# Patient Record
Sex: Male | Born: 1985 | Race: White | Hispanic: No | Marital: Single | Smoking: Current every day smoker
Health system: Southern US, Community
[De-identification: ages and names within clinical notes are randomized; demographics above are authoritative.]

## PROBLEM LIST (undated history)

## (undated) DIAGNOSIS — J302 Other seasonal allergic rhinitis: Secondary | ICD-10-CM

## (undated) DIAGNOSIS — F909 Attention-deficit hyperactivity disorder, unspecified type: Secondary | ICD-10-CM

## (undated) DIAGNOSIS — F1910 Other psychoactive substance abuse, uncomplicated: Secondary | ICD-10-CM

## (undated) HISTORY — PX: TYMPANOSTOMY TUBE PLACEMENT: SHX32

---
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
## 2008-08-25 ENCOUNTER — Emergency Department (HOSPITAL_COMMUNITY): Admission: EM | Admit: 2008-08-25 | Discharge: 2008-08-25 | Payer: Self-pay | Admitting: Emergency Medicine

## 2009-06-21 ENCOUNTER — Emergency Department (HOSPITAL_BASED_OUTPATIENT_CLINIC_OR_DEPARTMENT_OTHER): Admission: EM | Admit: 2009-06-21 | Discharge: 2009-06-21 | Payer: Self-pay | Admitting: Emergency Medicine

## 2011-01-02 ENCOUNTER — Emergency Department (HOSPITAL_BASED_OUTPATIENT_CLINIC_OR_DEPARTMENT_OTHER)
Admission: EM | Admit: 2011-01-02 | Discharge: 2011-01-02 | Disposition: A | Discharge status: Home / Self Care | Payer: 59 | Attending: Emergency Medicine | Admitting: Emergency Medicine

## 2011-01-02 DIAGNOSIS — K089 Disorder of teeth and supporting structures, unspecified: Secondary | ICD-10-CM | POA: Insufficient documentation

## 2011-01-02 DIAGNOSIS — K029 Dental caries, unspecified: Secondary | ICD-10-CM | POA: Insufficient documentation

## 2011-01-02 DIAGNOSIS — F17200 Nicotine dependence, unspecified, uncomplicated: Secondary | ICD-10-CM | POA: Insufficient documentation

## 2011-04-18 ENCOUNTER — Emergency Department (HOSPITAL_COMMUNITY)
Admission: EM | Admit: 2011-04-18 | Discharge: 2011-04-18 | Disposition: A | Discharge status: Home / Self Care | Payer: 59 | Attending: Emergency Medicine | Admitting: Emergency Medicine

## 2011-04-18 ENCOUNTER — Emergency Department (HOSPITAL_COMMUNITY): Payer: 59

## 2011-04-18 DIAGNOSIS — S139XXA Sprain of joints and ligaments of unspecified parts of neck, initial encounter: Secondary | ICD-10-CM | POA: Insufficient documentation

## 2011-04-18 DIAGNOSIS — M542 Cervicalgia: Secondary | ICD-10-CM | POA: Insufficient documentation

## 2011-04-18 DIAGNOSIS — R51 Headache: Secondary | ICD-10-CM | POA: Insufficient documentation

## 2011-04-18 DIAGNOSIS — S0990XA Unspecified injury of head, initial encounter: Secondary | ICD-10-CM | POA: Insufficient documentation

## 2011-04-18 DIAGNOSIS — F17200 Nicotine dependence, unspecified, uncomplicated: Secondary | ICD-10-CM | POA: Insufficient documentation
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
## 2013-01-11 ENCOUNTER — Encounter (HOSPITAL_BASED_OUTPATIENT_CLINIC_OR_DEPARTMENT_OTHER): Payer: Self-pay | Admitting: *Deleted

## 2013-01-11 ENCOUNTER — Emergency Department (HOSPITAL_BASED_OUTPATIENT_CLINIC_OR_DEPARTMENT_OTHER)
Admission: EM | Admit: 2013-01-11 | Discharge: 2013-01-11 | Disposition: A | Discharge status: Home / Self Care | Payer: 59 | Attending: Emergency Medicine | Admitting: Emergency Medicine

## 2013-01-11 DIAGNOSIS — K047 Periapical abscess without sinus: Secondary | ICD-10-CM

## 2013-01-11 DIAGNOSIS — K029 Dental caries, unspecified: Secondary | ICD-10-CM | POA: Insufficient documentation

## 2013-01-11 DIAGNOSIS — F17200 Nicotine dependence, unspecified, uncomplicated: Secondary | ICD-10-CM | POA: Insufficient documentation

## 2013-01-11 DIAGNOSIS — K044 Acute apical periodontitis of pulpal origin: Secondary | ICD-10-CM | POA: Insufficient documentation

## 2013-01-11 DIAGNOSIS — K089 Disorder of teeth and supporting structures, unspecified: Secondary | ICD-10-CM | POA: Insufficient documentation

## 2013-01-11 DIAGNOSIS — K0889 Other specified disorders of teeth and supporting structures: Secondary | ICD-10-CM

## 2013-01-11 MED ORDER — AMOXICILLIN 500 MG PO CAPS: 500.0000 mg | ORAL_CAPSULE | Freq: Three times a day (TID) | ORAL | Status: DC

## 2013-01-11 NOTE — ED Notes (Signed)
Toothache since this am.  

## 2013-01-11 NOTE — ED Provider Notes (Signed)
History     CSN: [id number]  Arrival date & time 01/11/13  2022   First MD Initiated Contact with Patient 01/11/13 2052      Chief Complaint  Patient presents with  . Dental Pain    (Consider location/radiation/quality/duration/timing/severity/associated sxs/prior treatment) HPI Comments: 27 y/o male presents to the ED with his mom complaining of left lower dental pain x 1 day. States he was sitting at home earlier today when symptoms began. Describes the pain as sharp and "hurting", non radiating rated 8/10. Took a hydrocodone he got from a friend with relief. Pain worse with cold and salt water. Denies difficulty swallowing, fever or chills. He does not have a dentist.  Patient is a 27 y.o. male presenting with tooth pain. The history is provided by the patient and a parent.  Dental PainPrimary symptoms do not include fever.  Additional symptoms do not include: trouble swallowing.    History reviewed. No pertinent past medical history.  History reviewed. No pertinent past surgical history.  No family history on file.  History  Substance Use Topics  . Smoking status: Current Every Day Smoker -- 1.00 packs/day    Types: Cigarettes  . Smokeless tobacco: Not on file  . Alcohol Use: No      Review of Systems  Constitutional: Negative for fever and chills.  HENT: Positive for dental problem. Negative for trouble swallowing.   All other systems reviewed and are negative.    Allergies  Review of patient's allergies indicates no known allergies.  Home Medications   Current Outpatient Rx  Name  Route  Sig  Dispense  Refill  . amoxicillin (AMOXIL) 500 MG capsule   Oral   Take 1 capsule (500 mg total) by mouth 3 (three) times daily.   21 capsule   0     BP 126/82  Pulse 73  Temp(Src) 97.9 F (36.6 C) (Oral)  Resp 16  Ht 5\' 6"  (1.676 m)  Wt 122 lb (55.339 kg)  BMI 19.7 kg/m2  SpO2 97%  Physical Exam  Nursing note and vitals reviewed. Constitutional: He is  oriented to person, place, and time. He appears well-developed and well-nourished. No distress.  HENT:  Head: Normocephalic and atraumatic. No trismus in the jaw.  Mouth/Throat: Uvula is midline, oropharynx is clear and moist and mucous membranes are normal. Abnormal dentition. Dental caries present. No dental abscesses.    Poor dentition throughout. Tooth decay present on multiple teeth.  Eyes: Conjunctivae are normal.  Neck: Normal range of motion. Neck supple.  Cardiovascular: Normal rate, regular rhythm and normal heart sounds.   Pulmonary/Chest: Effort normal and breath sounds normal. No respiratory distress.  Musculoskeletal: Normal range of motion. He exhibits no edema.  Lymphadenopathy:       Head (left side): Submandibular adenopathy present.  Neurological: He is alert and oriented to person, place, and time.  Skin: Skin is warm.  Psychiatric: He has a normal mood and affect. His behavior is normal.    ED Course  Procedures (including critical care time)  Labs Reviewed - No data to display No results found.   1. Pain, dental   2. Dental infection       MDM   Dental pain associated with dental infection. No evidence of dental abscess. Patient is afebrile, non toxic appearing and swallowing secretions well. I gave patient referral to dentist and stressed the importance of dental follow up for ultimate management of dental pain. I will also give amoxicillin. Advised ibuprofen/tylenol for  pain. Patient voices understanding and is agreeable to plan.        Trevor Mace, PA-C 01/11/13 2118

## 2013-01-11 NOTE — ED Provider Notes (Signed)
Medical screening examination/treatment/procedure(s) were performed by non-physician practitioner and as supervising physician I was immediately available for consultation/collaboration.   Moselle Rister, MD 01/11/13 2315 

## 2013-03-21 ENCOUNTER — Emergency Department (HOSPITAL_BASED_OUTPATIENT_CLINIC_OR_DEPARTMENT_OTHER)
Admission: EM | Admit: 2013-03-21 | Discharge: 2013-03-21 | Disposition: A | Discharge status: Home / Self Care | Payer: 59 | Attending: Emergency Medicine | Admitting: Emergency Medicine

## 2013-03-21 ENCOUNTER — Encounter (HOSPITAL_BASED_OUTPATIENT_CLINIC_OR_DEPARTMENT_OTHER): Payer: Self-pay | Admitting: *Deleted

## 2013-03-21 ENCOUNTER — Emergency Department (HOSPITAL_BASED_OUTPATIENT_CLINIC_OR_DEPARTMENT_OTHER): Payer: 59

## 2013-03-21 DIAGNOSIS — Y9289 Other specified places as the place of occurrence of the external cause: Secondary | ICD-10-CM | POA: Insufficient documentation

## 2013-03-21 DIAGNOSIS — Y9389 Activity, other specified: Secondary | ICD-10-CM | POA: Insufficient documentation

## 2013-03-21 DIAGNOSIS — F17200 Nicotine dependence, unspecified, uncomplicated: Secondary | ICD-10-CM | POA: Insufficient documentation

## 2013-03-21 DIAGNOSIS — S62308A Unspecified fracture of other metacarpal bone, initial encounter for closed fracture: Secondary | ICD-10-CM

## 2013-03-21 DIAGNOSIS — S62309A Unspecified fracture of unspecified metacarpal bone, initial encounter for closed fracture: Secondary | ICD-10-CM | POA: Insufficient documentation

## 2013-03-21 DIAGNOSIS — X58XXXA Exposure to other specified factors, initial encounter: Secondary | ICD-10-CM | POA: Insufficient documentation
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
## 2013-03-22 ENCOUNTER — Ambulatory Visit (INDEPENDENT_AMBULATORY_CARE_PROVIDER_SITE_OTHER): Payer: 59 | Admitting: Family Medicine

## 2013-03-22 ENCOUNTER — Encounter: Payer: Self-pay | Admitting: Family Medicine

## 2013-03-22 VITALS — BP 121/73 | HR 84 | Ht 66.0 in | Wt 120.0 lb

## 2013-03-22 DIAGNOSIS — S6992XA Unspecified injury of left wrist, hand and finger(s), initial encounter: Secondary | ICD-10-CM

## 2013-03-22 DIAGNOSIS — S62339A Displaced fracture of neck of unspecified metacarpal bone, initial encounter for closed fracture: Secondary | ICD-10-CM

## 2013-03-22 DIAGNOSIS — S6990XA Unspecified injury of unspecified wrist, hand and finger(s), initial encounter: Secondary | ICD-10-CM

## 2013-03-22 MED ORDER — HYDROCODONE-ACETAMINOPHEN 5-325 MG PO TABS: 1.0000 | ORAL_TABLET | Freq: Four times a day (QID) | ORAL | Status: DC | PRN

## 2013-03-22 NOTE — Patient Instructions (Addendum)
You have a fracture of your 3rd metacarpal. Wear the splint at all times as you would a cast. Ok to ice over this 15 minutes at a time 3-4 times a day. Keep elevated as much as possible. Ok to take aleve 2 tabs twice a day with food for pain and inflammation. Norco as needed for severe pain. Follow up with me in 2 weeks for repeat x-rays, reevaluation.

## 2013-03-23 ENCOUNTER — Encounter: Payer: Self-pay | Admitting: Family Medicine

## 2013-03-23 DIAGNOSIS — S62339A Displaced fracture of neck of unspecified metacarpal bone, initial encounter for closed fracture: Secondary | ICD-10-CM | POA: Insufficient documentation

## 2013-03-23 NOTE — Assessment & Plan Note (Signed)
Left incomplete distal 3rd metacarpal fracture - Changed patient to an ulnar gutter splint - only able to get about 30 degrees flexion of MCP joint due to pain.  Discussed icing, elevation, wearing splint as he would a cast.  Aleve, norco for pain.  F/u in 2 weeks to repeat evaluation, x-rays.
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
## 2013-04-05 ENCOUNTER — Ambulatory Visit: Payer: 59 | Admitting: Family Medicine

## 2013-06-03 ENCOUNTER — Emergency Department (HOSPITAL_BASED_OUTPATIENT_CLINIC_OR_DEPARTMENT_OTHER)
Admission: EM | Admit: 2013-06-03 | Discharge: 2013-06-03 | Disposition: A | Discharge status: Home / Self Care | Payer: 59 | Attending: Emergency Medicine | Admitting: Emergency Medicine

## 2013-06-03 ENCOUNTER — Encounter (HOSPITAL_BASED_OUTPATIENT_CLINIC_OR_DEPARTMENT_OTHER): Payer: Self-pay | Admitting: Emergency Medicine

## 2013-06-03 DIAGNOSIS — F17200 Nicotine dependence, unspecified, uncomplicated: Secondary | ICD-10-CM | POA: Insufficient documentation

## 2013-06-03 DIAGNOSIS — K089 Disorder of teeth and supporting structures, unspecified: Secondary | ICD-10-CM | POA: Insufficient documentation

## 2013-06-03 DIAGNOSIS — K0889 Other specified disorders of teeth and supporting structures: Secondary | ICD-10-CM

## 2013-06-03 MED ORDER — PENICILLIN V POTASSIUM 500 MG PO TABS: 500.0000 mg | ORAL_TABLET | Freq: Three times a day (TID) | ORAL | Status: DC

## 2013-06-03 MED ORDER — PENICILLIN V POTASSIUM 250 MG PO TABS
500.0000 mg | ORAL_TABLET | Freq: Once | ORAL | Status: AC
  Administered 2013-06-03: 500 mg via ORAL
  Filled 2013-06-03: qty 2

## 2013-06-03 NOTE — ED Notes (Signed)
Pt c/o left sided molar dental pain.

## 2013-06-03 NOTE — ED Provider Notes (Signed)
CSN: [id number]     Arrival date & time 06/03/13  2046 History     First MD Initiated Contact with Patient 06/03/13 2128     Chief Complaint  Patient presents with  . Dental Pain   (Consider location/radiation/quality/duration/timing/severity/associated sxs/prior Treatment) Patient is a 27 y.o. male presenting with tooth pain. The history is provided by the patient.  Dental Pain Location:  Lower Lower teeth location:  19/LL 1st molar and 17/LL 3rd molar Quality:  Aching Severity:  Mild Onset quality:  Gradual Timing:  Constant Chronicity:  Recurrent Context: dental caries   Context: not abscess   Relieved by:  Nothing Worsened by:  Hot food/drink and jaw movement Associated symptoms: no difficulty swallowing, no fever, no gum swelling, no neck swelling and no oral bleeding   Risk factors: lack of dental care and smoking   Risk factors: no cancer and no chewing tobacco use     History reviewed. No pertinent past medical history. History reviewed. No pertinent past surgical history. Family History  Problem Relation Age of Onset  . Diabetes Mother   . Hyperlipidemia Mother   . Hypertension Mother   . Hypertension Brother   . Heart attack Neg Hx   . Sudden death Neg Hx    History  Substance Use Topics  . Smoking status: Current Every Day Smoker -- 1.00 packs/day    Types: Cigarettes  . Smokeless tobacco: Not on file  . Alcohol Use: No    Review of Systems  Constitutional: Negative for fever.  All other systems reviewed and are negative.    Allergies  Review of patient's allergies indicates no known allergies.  Home Medications   Current Outpatient Rx  Name  Route  Sig  Dispense  Refill  . ibuprofen (ADVIL,MOTRIN) 200 MG tablet   Oral   Take 400 mg by mouth as needed for pain.         Marland Kitchen HYDROcodone-acetaminophen (NORCO) 5-325 MG per tablet   Oral   Take 1 tablet by mouth every 6 (six) hours as needed for pain.   60 tablet   0   . penicillin v  potassium (VEETID) 500 MG tablet   Oral   Take 1 tablet (500 mg total) by mouth 3 (three) times daily.   30 tablet   0    BP 138/86  Pulse 82  Temp(Src) 98.1 F (36.7 C) (Oral)  Resp 18  Ht 5\' 6"  (1.676 m)  Wt 120 lb (54.432 kg)  BMI 19.38 kg/m2  SpO2 98% Physical Exam  Nursing note and vitals reviewed. Constitutional: He is oriented to person, place, and time. He appears well-developed and well-nourished.  HENT:  Head: Normocephalic and atraumatic.  Right Ear: External ear normal.  Left Ear: External ear normal.  Mouth/Throat: Oropharynx is clear and moist.  Pain and dental caries left lower molars  Eyes: Conjunctivae are normal. Pupils are equal, round, and reactive to light.  Cardiovascular: Normal rate.   Pulmonary/Chest: He is in respiratory distress.  Abdominal: Soft. Bowel sounds are normal.  Musculoskeletal: Normal range of motion.  Neurological: He is alert and oriented to person, place, and time.  Skin: Skin is warm and dry.  Psychiatric: He has a normal mood and affect.    ED Course   Procedures (including critical care time)  Labs Reviewed - No data to display No results found. 1. Pain, dental     MDM  Patient with dental caries with increased pain, no swelling.   Duwayne Heck  Denny Levy, MD 06/03/13 2151

## 2013-06-03 NOTE — ED Notes (Signed)
MD at bedside. 

## 2014-05-26 ENCOUNTER — Emergency Department (HOSPITAL_COMMUNITY)
Admission: EM | Admit: 2014-05-26 | Discharge: 2014-05-26 | Disposition: A | Discharge status: Home / Self Care | Payer: 59 | Attending: Emergency Medicine | Admitting: Emergency Medicine

## 2014-05-26 ENCOUNTER — Encounter (HOSPITAL_COMMUNITY): Payer: Self-pay | Admitting: Emergency Medicine

## 2014-05-26 DIAGNOSIS — S46812A Strain of other muscles, fascia and tendons at shoulder and upper arm level, left arm, initial encounter: Secondary | ICD-10-CM

## 2014-05-26 DIAGNOSIS — Z792 Long term (current) use of antibiotics: Secondary | ICD-10-CM | POA: Insufficient documentation

## 2014-05-26 DIAGNOSIS — S0990XA Unspecified injury of head, initial encounter: Secondary | ICD-10-CM | POA: Diagnosis not present

## 2014-05-26 DIAGNOSIS — S46819A Strain of other muscles, fascia and tendons at shoulder and upper arm level, unspecified arm, initial encounter: Principal | ICD-10-CM

## 2014-05-26 DIAGNOSIS — Y9389 Activity, other specified: Secondary | ICD-10-CM | POA: Diagnosis not present

## 2014-05-26 DIAGNOSIS — Y92410 Unspecified street and highway as the place of occurrence of the external cause: Secondary | ICD-10-CM | POA: Insufficient documentation

## 2014-05-26 DIAGNOSIS — S0993XA Unspecified injury of face, initial encounter: Secondary | ICD-10-CM | POA: Diagnosis present

## 2014-05-26 DIAGNOSIS — F17200 Nicotine dependence, unspecified, uncomplicated: Secondary | ICD-10-CM | POA: Insufficient documentation

## 2014-05-26 DIAGNOSIS — S43499A Other sprain of unspecified shoulder joint, initial encounter: Secondary | ICD-10-CM | POA: Diagnosis not present

## 2014-05-26 DIAGNOSIS — S199XXA Unspecified injury of neck, initial encounter: Secondary | ICD-10-CM

## 2014-05-26 MED ORDER — CYCLOBENZAPRINE HCL 10 MG PO TABS: 10.0000 mg | ORAL_TABLET | Freq: Two times a day (BID) | ORAL | Status: DC | PRN
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
## 2014-05-26 NOTE — Discharge Instructions (Signed)
Take Flexeril as needed for muscle spasm. Refer to attached documents for more information.  °
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
## 2014-05-27 NOTE — ED Provider Notes (Signed)
Medical screening examination/treatment/procedure(s) were performed by non-physician practitioner and as supervising physician I was immediately available for consultation/collaboration.  Megan Docherty, MD 05/27/14 2029 

## 2014-05-30 ENCOUNTER — Encounter (HOSPITAL_BASED_OUTPATIENT_CLINIC_OR_DEPARTMENT_OTHER): Payer: Self-pay | Admitting: Emergency Medicine

## 2014-05-30 ENCOUNTER — Emergency Department (HOSPITAL_BASED_OUTPATIENT_CLINIC_OR_DEPARTMENT_OTHER)
Admission: EM | Admit: 2014-05-30 | Discharge: 2014-05-31 | Disposition: A | Discharge status: Home / Self Care | Payer: 59 | Attending: Emergency Medicine | Admitting: Emergency Medicine

## 2014-05-30 DIAGNOSIS — Z792 Long term (current) use of antibiotics: Secondary | ICD-10-CM | POA: Diagnosis not present

## 2014-05-30 DIAGNOSIS — F17200 Nicotine dependence, unspecified, uncomplicated: Secondary | ICD-10-CM | POA: Diagnosis not present

## 2014-05-30 DIAGNOSIS — R253 Fasciculation: Secondary | ICD-10-CM

## 2014-05-30 DIAGNOSIS — Z87828 Personal history of other (healed) physical injury and trauma: Secondary | ICD-10-CM | POA: Insufficient documentation

## 2014-05-30 DIAGNOSIS — Z791 Long term (current) use of non-steroidal anti-inflammatories (NSAID): Secondary | ICD-10-CM | POA: Insufficient documentation

## 2014-05-30 DIAGNOSIS — R259 Unspecified abnormal involuntary movements: Secondary | ICD-10-CM | POA: Diagnosis not present

## 2014-05-30 IMAGING — CR DG HAND COMPLETE 3+V*L*
3 series · 3 of 3 positions shown · non-contrast
Comparison: No priors.

CLINICAL DATA: Pain injury.  Pain in the middle finger and back of
the hand.

LEFT HAND - COMPLETE 3+ VIEW

[x hand pa left]
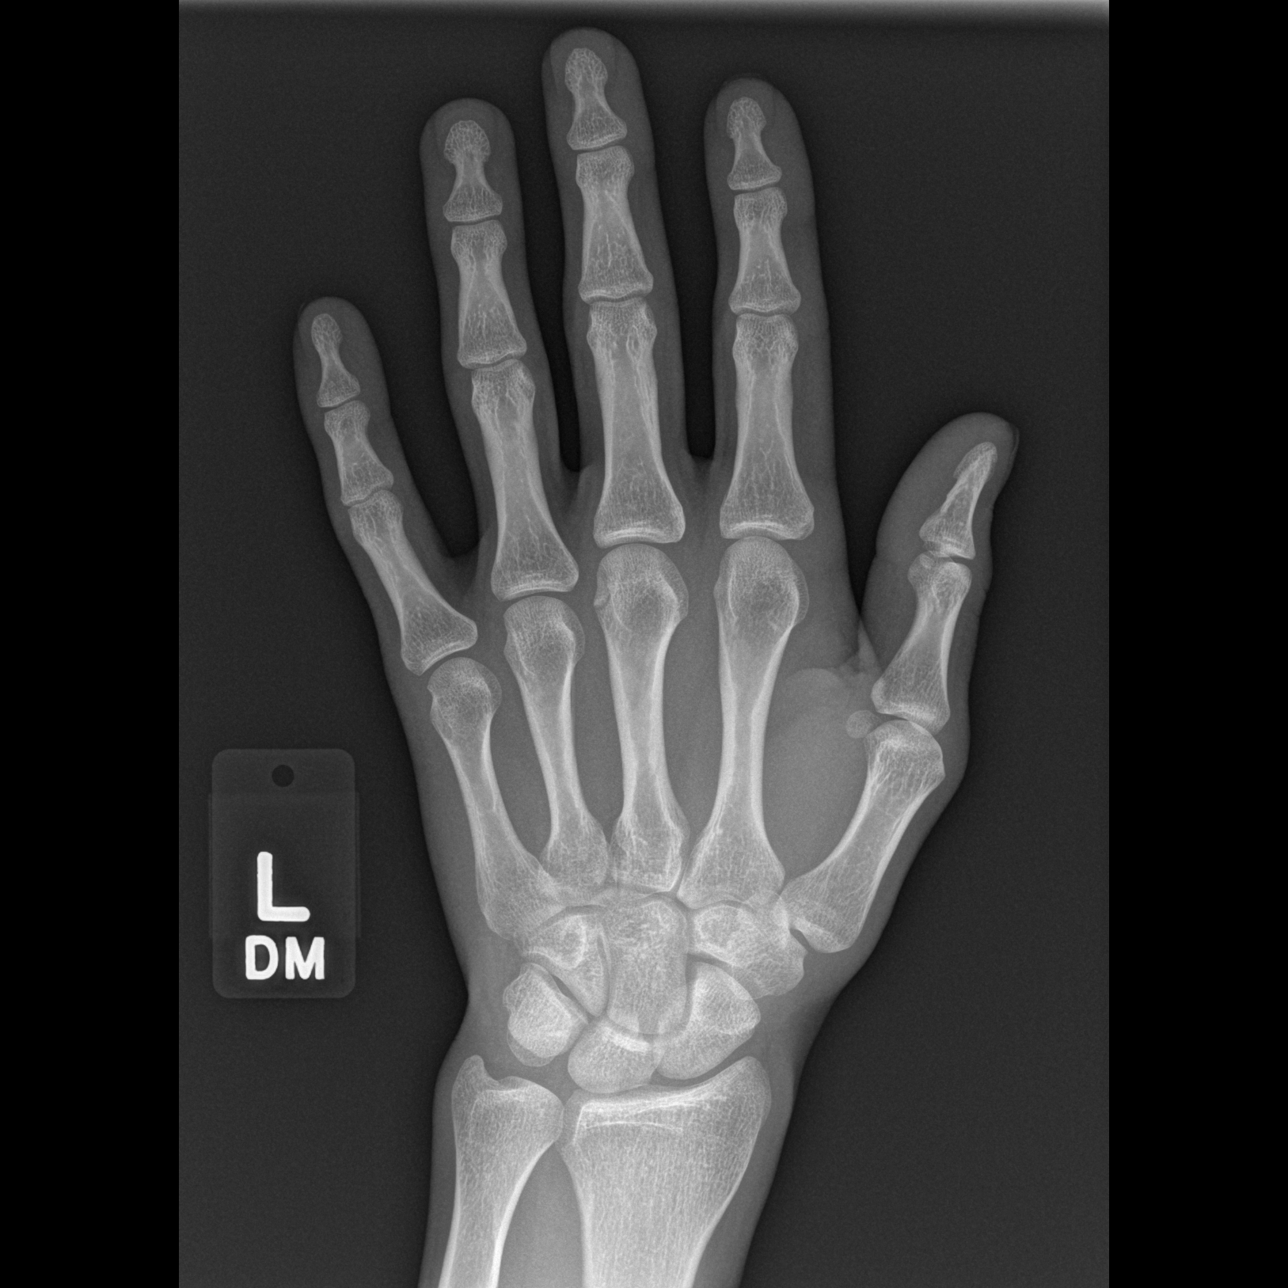

[x hand oblique left]
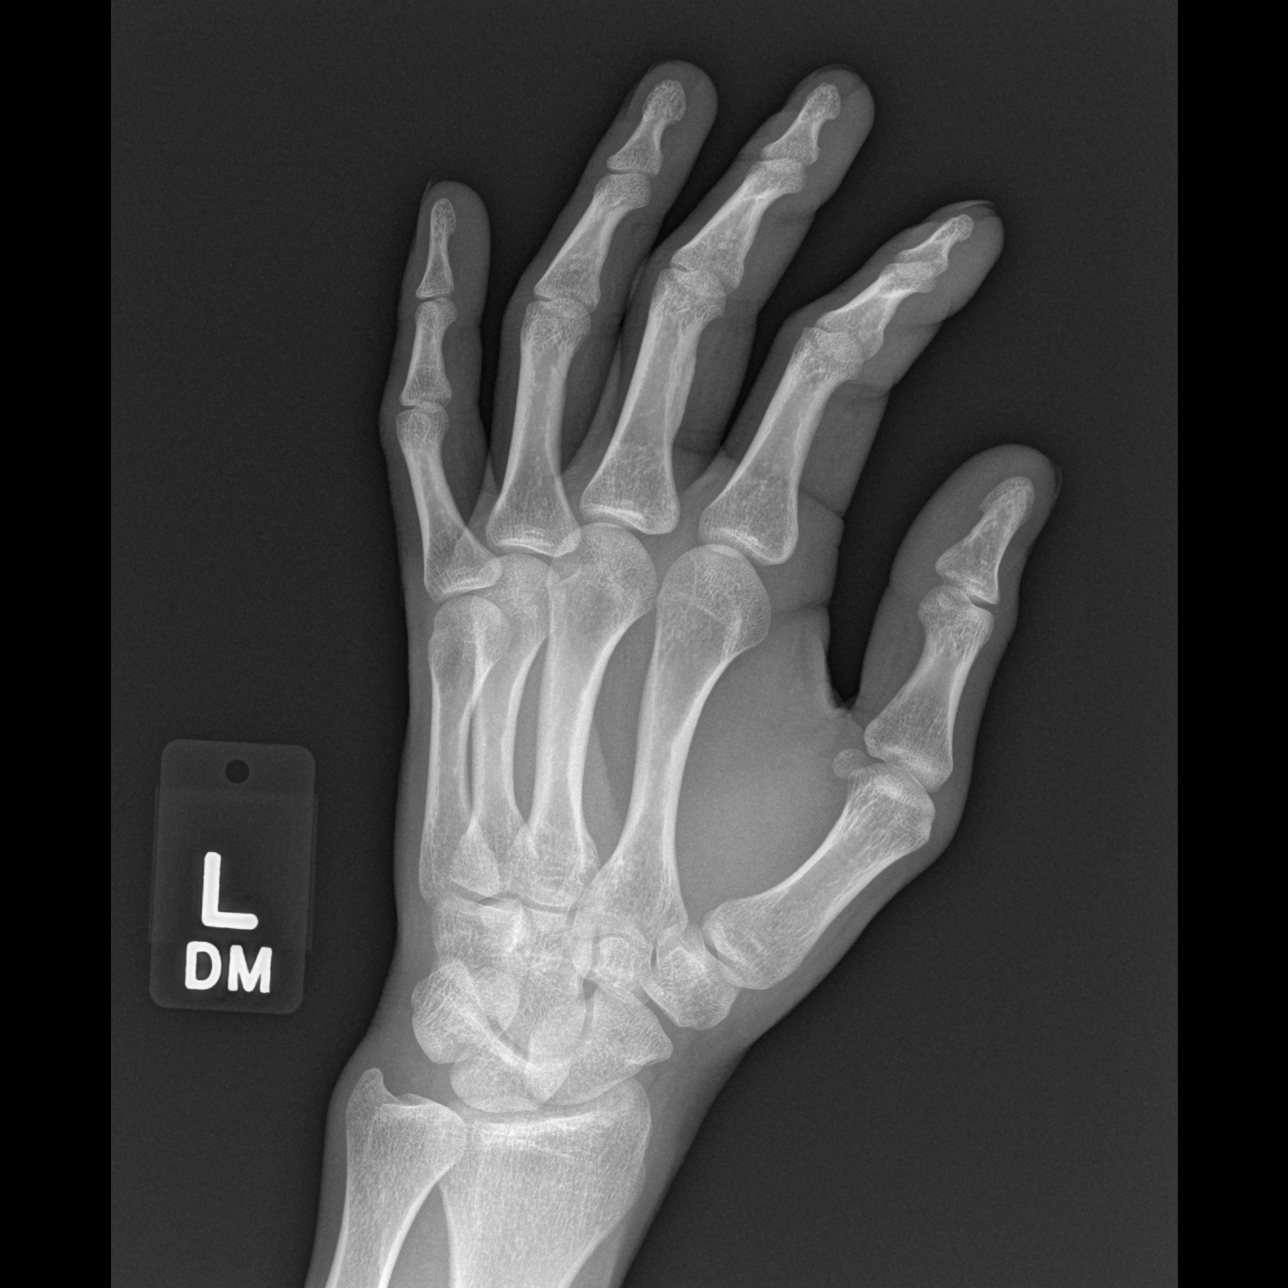

[x hand lat left]
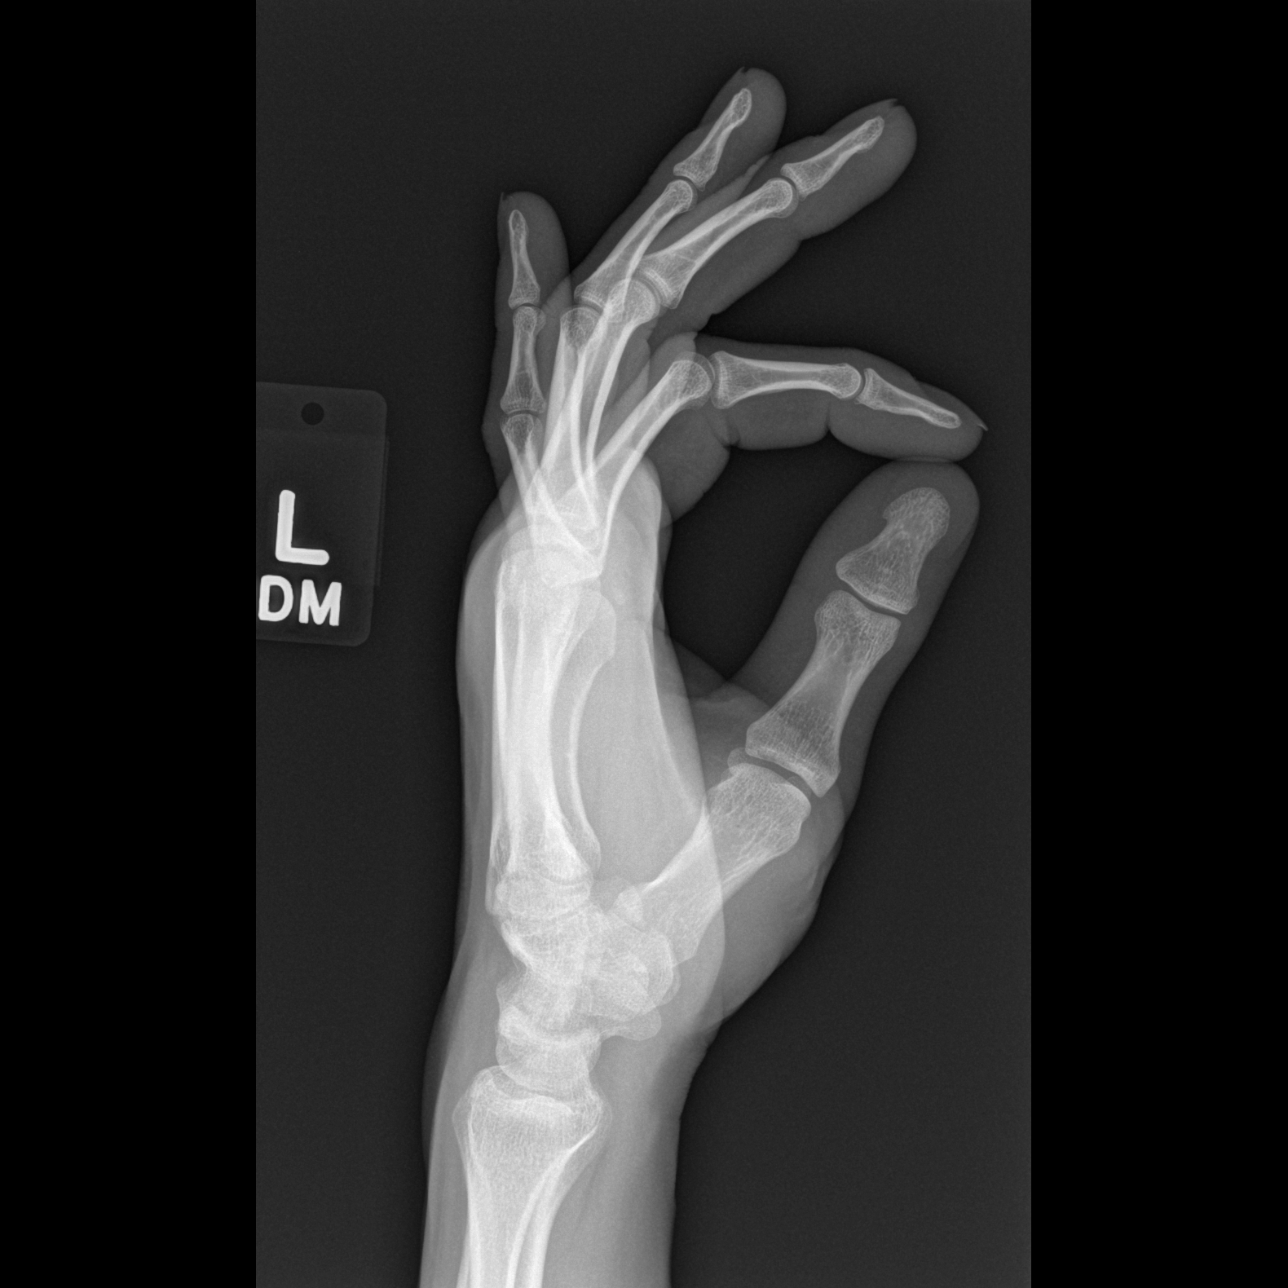

[3 of 3 positions shown; findings below may reference images not displayed]

FINDINGS: Three views of the left hand demonstrates an incomplete
fracture through the ulnar aspect of the distal third metacarpal.
No other acute displaced fracture, subluxation or dislocation is
noted.  Overlying soft tissues appear swollen.
IMPRESSION: Incomplete, probable impaction fracture, of the ulnar aspect of the
distal third metacarpal.
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
## 2014-05-31 ENCOUNTER — Encounter (HOSPITAL_BASED_OUTPATIENT_CLINIC_OR_DEPARTMENT_OTHER): Payer: Self-pay | Admitting: Emergency Medicine
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
## 2014-07-24 ENCOUNTER — Emergency Department (HOSPITAL_BASED_OUTPATIENT_CLINIC_OR_DEPARTMENT_OTHER)
Admission: EM | Admit: 2014-07-24 | Discharge: 2014-07-24 | Disposition: A | Discharge status: Home / Self Care | Payer: 59 | Attending: Emergency Medicine | Admitting: Emergency Medicine

## 2014-07-24 ENCOUNTER — Encounter (HOSPITAL_BASED_OUTPATIENT_CLINIC_OR_DEPARTMENT_OTHER): Payer: Self-pay | Admitting: Emergency Medicine

## 2014-07-24 DIAGNOSIS — Z720 Tobacco use: Secondary | ICD-10-CM | POA: Diagnosis not present

## 2014-07-24 DIAGNOSIS — Z792 Long term (current) use of antibiotics: Secondary | ICD-10-CM | POA: Diagnosis not present

## 2014-07-24 DIAGNOSIS — K088 Other specified disorders of teeth and supporting structures: Secondary | ICD-10-CM | POA: Insufficient documentation

## 2014-07-24 DIAGNOSIS — K0889 Other specified disorders of teeth and supporting structures: Secondary | ICD-10-CM

## 2014-07-24 HISTORY — DX: Other seasonal allergic rhinitis: J30.2

## 2014-07-24 MED ORDER — AMOXICILLIN 500 MG PO CAPS
500.0000 mg | ORAL_CAPSULE | Freq: Three times a day (TID) | ORAL | Status: AC
Start: 2014-07-24 — End: 2014-08-03

## 2014-07-24 MED ORDER — HYDROCODONE-ACETAMINOPHEN 5-325 MG PO TABS: 2.0000 | ORAL_TABLET | ORAL | Status: DC | PRN

## 2014-07-24 NOTE — ED Notes (Signed)
patient c/o L lower side dental pain for the past two days, states he had sinus pressure and took some allegra-d for relief, but tooth continues to hurt. Has taken ibuprofen and some amoxicillin his girl friend gave him

## 2014-07-24 NOTE — Discharge Instructions (Signed)

## 2014-07-24 NOTE — ED Provider Notes (Signed)
CSN: [id number]     Arrival date & time 07/24/14  1429 History   First MD Initiated Contact with Patient 07/24/14 1457     Chief Complaint  Patient presents with  . Dental Pain     (Consider location/radiation/quality/duration/timing/severity/associated sxs/prior Treatment) Patient is a 28 y.o. male presenting with tooth pain. The history is provided by the patient. No language interpreter was used.  Dental Pain Location:  Lower Lower teeth location:  21/LL 1st bicuspid, 20/LL 2nd bicuspid, 19/LL 1st molar and 18/LL 2nd molar Quality:  Shooting Severity:  Moderate Onset quality:  Gradual Duration:  2 days Timing:  Constant Progression:  Worsening Chronicity:  New Context: poor dentition   Relieved by:  Nothing Worsened by:  Nothing tried Ineffective treatments:  None tried Associated symptoms: facial pain   Risk factors: no alcohol problem     Past Medical History  Diagnosis Date  . Seasonal allergies    History reviewed. No pertinent past surgical history. Family History  Problem Relation Age of Onset  . Diabetes Mother   . Hyperlipidemia Mother   . Hypertension Mother   . Hypertension Brother   . Heart attack Neg Hx   . Sudden death Neg Hx    History  Substance Use Topics  . Smoking status: Current Every Day Smoker -- 1.00 packs/day    Types: Cigarettes  . Smokeless tobacco: Not on file  . Alcohol Use: No    Review of Systems  All other systems reviewed and are negative.     Allergies  Review of patient's allergies indicates no known allergies.  Home Medications   Prior to Admission medications   Medication Sig Start Date End Date Taking? Authorizing Provider  amoxicillin (AMOXIL) 500 MG capsule Take 500 mg by mouth 2 (two) times daily. For 14 days 05/13/14   Historical Provider, MD  amoxicillin (AMOXIL) 500 MG capsule Take 1 capsule (500 mg total) by mouth 3 (three) times daily. 07/24/14 08/03/14  Elson AreasLeslie K Maddix Heinz, PA-C  cyclobenzaprine (FLEXERIL) 10  MG tablet Take 1 tablet (10 mg total) by mouth 2 (two) times daily as needed for muscle spasms. 05/26/14   Emilia BeckKaitlyn Szekalski, PA-C  HYDROcodone-acetaminophen (NORCO/VICODIN) 5-325 MG per tablet Take 2 tablets by mouth every 4 (four) hours as needed. 07/24/14   Elson AreasLeslie K Phyllistine Domingos, PA-C  ibuprofen (ADVIL,MOTRIN) 200 MG tablet Take 400-600 mg by mouth as needed for headache or moderate pain.     Historical Provider, MD   BP 120/80  Pulse 86  Temp(Src) 98.2 F (36.8 C) (Oral)  Resp 20  SpO2 100% Physical Exam  Nursing note and vitals reviewed. Constitutional: He is oriented to person, place, and time. He appears well-developed and well-nourished.  HENT:  Head: Normocephalic.  Broken decayed black teeth down to gum line left side of mouth.  Eyes: EOM are normal.  Neck: Normal range of motion.  Pulmonary/Chest: Effort normal.  Abdominal: He exhibits no distension.  Musculoskeletal: Normal range of motion.  Neurological: He is alert and oriented to person, place, and time.  Psychiatric: He has a normal mood and affect.    ED Course  Procedures (including critical care time) Labs Review Labs Reviewed - No data to display  Imaging Review No results found.   EKG Interpretation None      MDM   Final diagnoses:  Tooth ache    Schedule to see Dr. Lucky CowboyKnox for evaluation Hydrocodone amoxicillian    Elson AreasLeslie K Junior Huezo, PA-C 07/24/14 1519

## 2014-07-28 NOTE — ED Provider Notes (Signed)
Medical screening examination/treatment/procedure(s) were performed by non-physician practitioner and as supervising physician I was immediately available for consultation/collaboration.    Lauriann Milillo L Lindbergh Winkles, MD 07/28/14 1618 

## 2014-12-28 ENCOUNTER — Emergency Department (HOSPITAL_COMMUNITY)
Admission: EM | Admit: 2014-12-28 | Discharge: 2014-12-28 | Discharge status: Against Medical Advice | Payer: Self-pay | Attending: Emergency Medicine | Admitting: Emergency Medicine

## 2014-12-28 ENCOUNTER — Encounter (HOSPITAL_COMMUNITY): Payer: Self-pay | Admitting: Emergency Medicine

## 2014-12-28 DIAGNOSIS — F1510 Other stimulant abuse, uncomplicated: Secondary | ICD-10-CM | POA: Insufficient documentation

## 2014-12-28 DIAGNOSIS — Z720 Tobacco use: Secondary | ICD-10-CM | POA: Insufficient documentation

## 2014-12-28 DIAGNOSIS — Z792 Long term (current) use of antibiotics: Secondary | ICD-10-CM | POA: Insufficient documentation

## 2014-12-28 DIAGNOSIS — F1910 Other psychoactive substance abuse, uncomplicated: Secondary | ICD-10-CM

## 2014-12-28 HISTORY — DX: Other psychoactive substance abuse, uncomplicated: F19.10

## 2014-12-28 NOTE — ED Notes (Signed)
Patient has refused to have labs drawn or medical exam completed.  Patient stated he will come back tomorrow.  Per Clydie BraunKaren, [place] patient is not suicidal or homicidal

## 2014-12-28 NOTE — ED Provider Notes (Signed)
CSN: [id number]     Arrival date & time 12/28/14  2048 History   First MD Initiated Contact with Patient 12/28/14 2151     Chief Complaint  Patient presents with  . Drug Problem     (Consider location/radiation/quality/duration/timing/severity/associated sxs/prior Treatment) Patient is a 29 y.o. male presenting with drug problem. The history is provided by the patient and a parent. No language interpreter was used.  Drug Problem This is a recurrent problem. The current episode started more than 1 year ago. The problem occurs constantly. The problem has been unchanged. Associated symptoms include vomiting. Nothing aggravates the symptoms. He has tried nothing for the symptoms. The treatment provided no relief.   Pt advised to come in for evaluation/medical clearance [facility].  Pt uses methamphetamine.  Pt has decided he does not want to stay.  Pt refuses urine and blood work.  Pt reports he is going home and will come in tomorrow. Pt denies suicidal or homicidal thoughts.  Mother reports pt is going to return tomorrow. Past Medical History  Diagnosis Date  . Seasonal allergies   . Drug abuse    History reviewed. No pertinent past surgical history. Family History  Problem Relation Age of Onset  . Diabetes Mother   . Hyperlipidemia Mother   . Hypertension Mother   . Hypertension Brother   . Heart attack Neg Hx   . Sudden death Neg Hx    History  Substance Use Topics  . Smoking status: Current Every Day Smoker -- 1.00 packs/day    Types: Cigarettes  . Smokeless tobacco: Not on file  . Alcohol Use: No    Review of Systems  Gastrointestinal: Positive for vomiting.  All other systems reviewed and are negative.     Allergies  Review of patient's allergies indicates no known allergies.  Home Medications   Prior to Admission medications   Medication Sig Start Date End Date Taking? Authorizing Provider  amoxicillin (AMOXIL) 500 MG capsule Take 500 mg by mouth 2 (two)  times daily. For 14 days 05/13/14   Historical Provider, MD  cyclobenzaprine (FLEXERIL) 10 MG tablet Take 1 tablet (10 mg total) by mouth 2 (two) times daily as needed for muscle spasms. 05/26/14   Emilia BeckKaitlyn Szekalski, PA-C  HYDROcodone-acetaminophen (NORCO/VICODIN) 5-325 MG per tablet Take 2 tablets by mouth every 4 (four) hours as needed. 07/24/14   Elson AreasLeslie K Adreana Coull, PA-C  ibuprofen (ADVIL,MOTRIN) 200 MG tablet Take 400-600 mg by mouth as needed for headache or moderate pain.     Historical Provider, MD   BP 139/74 mmHg  Pulse 104  Temp(Src) 97.9 F (36.6 C)  Resp 20  SpO2 97% Physical Exam  Constitutional: He is oriented to person, place, and time. He appears well-developed and well-nourished.  HENT:  Head: Normocephalic.  Eyes: Conjunctivae and EOM are normal. Pupils are equal, round, and reactive to light.  Neck: Normal range of motion.  Cardiovascular: Normal rate and normal heart sounds.   Pulmonary/Chest: Effort normal and breath sounds normal.  Abdominal: He exhibits no distension.  Musculoskeletal: Normal range of motion.  Neurological: He is alert and oriented to person, place, and time.  Skin: Skin is warm.  Psychiatric: He has a normal mood and affect.  Nursing note and vitals reviewed.   ED Course  Procedures (including critical care time) Labs Review Labs Reviewed  ACETAMINOPHEN LEVEL  CBC  COMPREHENSIVE METABOLIC PANEL  ETHANOL  SALICYLATE LEVEL  URINE RAPID DRUG SCREEN (HOSP PERFORMED)  Imaging Review No results found.   EKG Interpretation None      MDM   Final diagnoses:  Substance abuse    Pt signed out ama   Elson Areas, PA-C 12/28/14 2200  Mirian Mo, MD 01/03/15 [phone]

## 2014-12-28 NOTE — ED Notes (Signed)
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  Pt here with his mother that states pt's parole officer told him to come to ED to be cleared [facility].  Mother reports pt already has a bed there.  Pt last used crystal meth this morning.  Denies etoh.  Denies suicidal/homicidal ideation.

## 2015-05-13 ENCOUNTER — Emergency Department (HOSPITAL_BASED_OUTPATIENT_CLINIC_OR_DEPARTMENT_OTHER): Payer: Self-pay

## 2015-05-13 ENCOUNTER — Emergency Department (HOSPITAL_BASED_OUTPATIENT_CLINIC_OR_DEPARTMENT_OTHER)
Admission: EM | Admit: 2015-05-13 | Discharge: 2015-05-13 | Disposition: A | Discharge status: Home / Self Care | Payer: Self-pay | Attending: Emergency Medicine | Admitting: Emergency Medicine

## 2015-05-13 ENCOUNTER — Encounter (HOSPITAL_BASED_OUTPATIENT_CLINIC_OR_DEPARTMENT_OTHER): Payer: Self-pay | Admitting: *Deleted

## 2015-05-13 DIAGNOSIS — Y998 Other external cause status: Secondary | ICD-10-CM | POA: Insufficient documentation

## 2015-05-13 DIAGNOSIS — S62316A Displaced fracture of base of fifth metacarpal bone, right hand, initial encounter for closed fracture: Secondary | ICD-10-CM | POA: Insufficient documentation

## 2015-05-13 DIAGNOSIS — Y9289 Other specified places as the place of occurrence of the external cause: Secondary | ICD-10-CM | POA: Insufficient documentation

## 2015-05-13 DIAGNOSIS — Z792 Long term (current) use of antibiotics: Secondary | ICD-10-CM | POA: Insufficient documentation

## 2015-05-13 DIAGNOSIS — Z720 Tobacco use: Secondary | ICD-10-CM | POA: Insufficient documentation

## 2015-05-13 DIAGNOSIS — W2201XA Walked into wall, initial encounter: Secondary | ICD-10-CM | POA: Insufficient documentation

## 2015-05-13 DIAGNOSIS — Y9389 Activity, other specified: Secondary | ICD-10-CM | POA: Insufficient documentation

## 2015-05-13 DIAGNOSIS — S62339A Displaced fracture of neck of unspecified metacarpal bone, initial encounter for closed fracture: Secondary | ICD-10-CM

## 2015-05-13 MED ORDER — HYDROCODONE-ACETAMINOPHEN 5-325 MG PO TABS: 1.0000 | ORAL_TABLET | ORAL | Status: DC | PRN

## 2015-05-13 NOTE — ED Provider Notes (Signed)
CSN: [id number]     Arrival date & time 05/13/15  2103 History  This chart was scribed for Darius Octave, MD by Budd Palmer, ED Scribe. This patient was seen in room MHT13/MHT13 and the patient's care was started at 10:03 PM.    Chief Complaint  Patient presents with  . Hand Injury   The history is provided by the patient. No language interpreter was used.   HPI Comments: Darius Kennedy is a 29 y.o. male who presents to the Emergency Department complaining of an injury to the right hand sustained 2 days ago when he punched a sign out of frustration. He reports associated pain and swelling to the outer edge. He has pain with bending the right 5th finger. He is not currently on any medication. Pt denies numbness or tingling.  Past Medical History  Diagnosis Date  . Seasonal allergies   . Drug abuse    History reviewed. No pertinent past surgical history. Family History  Problem Relation Age of Onset  . Diabetes Mother   . Hyperlipidemia Mother   . Hypertension Mother   . Hypertension Brother   . Heart attack Neg Hx   . Sudden death Neg Hx    History  Substance Use Topics  . Smoking status: Current Every Day Smoker -- 1.00 packs/day    Types: Cigarettes  . Smokeless tobacco: Not on file  . Alcohol Use: No    Review of Systems A complete 10 system review of systems was obtained and all systems are negative except as noted in the HPI and PMH.    Allergies  Review of patient's allergies indicates no known allergies.  Home Medications   Prior to Admission medications   Medication Sig Start Date End Date Taking? Authorizing Provider  amoxicillin (AMOXIL) 500 MG capsule Take 500 mg by mouth 2 (two) times daily. For 14 days 05/13/14   Historical Provider, MD  cyclobenzaprine (FLEXERIL) 10 MG tablet Take 1 tablet (10 mg total) by mouth 2 (two) times daily as needed for muscle spasms. 05/26/14   Emilia Beck, PA-C  HYDROcodone-acetaminophen (NORCO/VICODIN) 5-325 MG per  tablet Take 1 tablet by mouth every 4 (four) hours as needed. 05/13/15   Darius Octave, MD  ibuprofen (ADVIL,MOTRIN) 200 MG tablet Take 400-600 mg by mouth as needed for headache or moderate pain.     Historical Provider, MD   BP 118/77 mmHg  Pulse 66  Temp(Src) 97.6 F (36.4 C) (Oral)  Resp 20  Ht 5' 6.5" (1.689 m)  Wt 108 lb (48.988 kg)  BMI 17.17 kg/m2  SpO2 100% Physical Exam  Constitutional: He is oriented to person, place, and time. He appears well-developed and well-nourished. No distress.  HENT:  Head: Normocephalic and atraumatic.  Mouth/Throat: Oropharynx is clear and moist. No oropharyngeal exudate.  Eyes: Conjunctivae and EOM are normal. Pupils are equal, round, and reactive to light.  Neck: Normal range of motion. Neck supple.  No meningismus.  Cardiovascular: Normal rate, regular rhythm, normal heart sounds and intact distal pulses.   No murmur heard. Pulmonary/Chest: Effort normal and breath sounds normal. No respiratory distress.  Abdominal: Soft. There is no tenderness. There is no rebound and no guarding.  Musculoskeletal: Normal range of motion. He exhibits edema and tenderness.  Swelling and ecchymosis to R 5th MCP. No open wounds. Pain with flexion of 5th digit. Intact radial pulse.  Neurological: He is alert and oriented to person, place, and time. No cranial nerve deficit. He exhibits normal muscle tone.  Coordination normal.  No ataxia on finger to nose bilaterally. No pronator drift. 5/5 strength throughout. CN 2-12 intact. Negative Romberg. Equal grip strength. Sensation intact. Gait is normal.   Skin: Skin is warm.  Psychiatric: He has a normal mood and affect. His behavior is normal.  Nursing note and vitals reviewed.   ED Course  Procedures  DIAGNOSTIC STUDIES: Oxygen Saturation is 100% on RA, normal by my interpretation.    COORDINATION OF CARE: 10:06 PM - Discussed XR results of Boxer's Fx. Discussed plans to put the hand in a splint. Will refer  to a hand specialist for possible surgery. Advised to elevate and apply ice pack. Pt advised of plan for treatment and pt agrees.  Labs Review Labs Reviewed - No data to display  Imaging Review Dg Hand Complete Right  05/13/2015   CLINICAL DATA:  Right hand pain and swelling after punching a brick wall 2 days prior.  EXAM: RIGHT HAND - COMPLETE 3+ VIEW  COMPARISON:  None.  FINDINGS: There is a mildly comminuted minimally angulated distal fifth metacarpal fracture. No extension to the metacarpal phalangeal joint. Mild associated soft tissue edema. No additional acute fracture. The alignment is otherwise maintained.  IMPRESSION: Mildly angulated comminuted distal fifth metacarpal fracture.   Electronically Signed   By: Rubye Oaks M.D.   On: 05/13/2015 21:33     EKG Interpretation None      MDM   Final diagnoses:  Boxer's fracture, closed, initial encounter   Right hand pain and swelling after punching a wall 2 days ago. No open wounds. Neurovascularly intact.  X-ray shows a boxer's fracture of fifth metacarpal.  Ulnar gutter splint, pain control, elevation, ice, follow-up with hand surgery. Review of narcotic database shows no recent narcotic prescriptions.   I personally performed the services described in this documentation, which was scribed in my presence. The recorded information has been reviewed and is accurate.   Darius Octave, MD 05/13/15 2251

## 2015-05-13 NOTE — Discharge Instructions (Signed)
Boxer's Fracture Follow up with Dr. Mina Marble. Return to the ED if you develop new or worsening symptoms. You have a break (fracture) of the fifth metacarpal bone. This is commonly called a boxer's fracture. This is the bone in the hand where the little finger attaches. The fracture is in the end of that bone, closest to the little finger. It is usually caused when you hit an object with a clenched fist. Often, the knuckle is pushed down by the impact. Sometimes, the fracture rotates out of position. A boxer's fracture will usually heal within 6 weeks, if it is treated properly and protected from re-injury. Surgery is sometimes needed. A cast, splint, or bulky hand dressing may be used to protect and immobilize a boxer's fracture. Do not remove this device or dressing until your caregiver approves. Keep your hand elevated, and apply ice packs for 15-20 minutes every 2 hours, for the first 2 days. Elevation and ice help reduce swelling and relieve pain. See your caregiver, or an orthopedic specialist, for follow-up care within the next 10 days. This is to make sure your fracture is healing properly. Document Released: 09/30/2005 Document Revised: 12/23/2011 Document Reviewed: 03/20/2007 [facility] Patient Information 2015 [place], [place]. This information is not intended to replace advice given to you by your health care provider. Make sure you discuss any questions you have with your health care provider.
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
## 2016-01-27 ENCOUNTER — Emergency Department (HOSPITAL_BASED_OUTPATIENT_CLINIC_OR_DEPARTMENT_OTHER)
Admission: EM | Admit: 2016-01-27 | Discharge: 2016-01-27 | Disposition: A | Discharge status: Home / Self Care | Payer: No Typology Code available for payment source | Attending: Emergency Medicine | Admitting: Emergency Medicine

## 2016-01-27 ENCOUNTER — Encounter (HOSPITAL_BASED_OUTPATIENT_CLINIC_OR_DEPARTMENT_OTHER): Payer: Self-pay

## 2016-01-27 DIAGNOSIS — K029 Dental caries, unspecified: Secondary | ICD-10-CM | POA: Insufficient documentation

## 2016-01-27 DIAGNOSIS — K0381 Cracked tooth: Secondary | ICD-10-CM | POA: Insufficient documentation

## 2016-01-27 DIAGNOSIS — K002 Abnormalities of size and form of teeth: Secondary | ICD-10-CM | POA: Insufficient documentation

## 2016-01-27 DIAGNOSIS — K0889 Other specified disorders of teeth and supporting structures: Secondary | ICD-10-CM | POA: Insufficient documentation

## 2016-01-27 DIAGNOSIS — F17210 Nicotine dependence, cigarettes, uncomplicated: Secondary | ICD-10-CM | POA: Insufficient documentation

## 2016-01-27 MED ORDER — PENICILLIN V POTASSIUM 500 MG PO TABS: 500.0000 mg | ORAL_TABLET | Freq: Two times a day (BID) | ORAL | Status: DC

## 2016-01-27 MED ORDER — IBUPROFEN 800 MG PO TABS: 800.0000 mg | ORAL_TABLET | Freq: Three times a day (TID) | ORAL | Status: DC

## 2016-01-27 NOTE — ED Provider Notes (Signed)
CSN: [id number]     Arrival date & time 01/27/16  4098 History   First MD Initiated Contact with Patient 01/27/16 1006     Chief Complaint  Patient presents with  . Dental Pain     (Consider location/radiation/quality/duration/timing/severity/associated sxs/prior Treatment) HPI   Patient is a 30 year old male with no pertinent past medical history presents the ED with complaint of left lower dental pain, onset 2 days. Patient reports having worsening pain to his left lower molar. He notes he has had similar pain in the past to the same tooth and states he was last seen in the ED for similar pain one year ago. He notes his tooth has been broken for "a while now". Patient denies following up with a dentist. He notes he has been taking BC Goody's powder at home without relief. Denies fever, chills, facial/neck swelling, drainage or dysphasia, drooling, difficulty breathing, trismus.  Past Medical History  Diagnosis Date  . Seasonal allergies   . Drug abuse    History reviewed. No pertinent past surgical history. Family History  Problem Relation Age of Onset  . Diabetes Mother   . Hyperlipidemia Mother   . Hypertension Mother   . Hypertension Brother   . Heart attack Neg Hx   . Sudden death Neg Hx    Social History  Substance Use Topics  . Smoking status: Current Every Day Smoker -- 1.00 packs/day    Types: Cigarettes  . Smokeless tobacco: None  . Alcohol Use: No    Review of Systems  Constitutional: Negative for fever and chills.  HENT: Positive for dental problem. Negative for facial swelling, sore throat, trouble swallowing and voice change.   Respiratory: Negative for shortness of breath.   Neurological: Negative for headaches.      Allergies  Review of patient's allergies indicates no known allergies.  Home Medications   Prior to Admission medications   Medication Sig Start Date End Date Taking? Authorizing Provider  ibuprofen (ADVIL,MOTRIN) 800 MG tablet Take 1  tablet (800 mg total) by mouth 3 (three) times daily. 01/27/16   Barrett Henle, PA-C  penicillin v potassium (VEETID) 500 MG tablet Take 1 tablet (500 mg total) by mouth 2 (two) times daily. 01/27/16   Satira Sark Valentine Kuechle, PA-C   BP 139/102 mmHg  Pulse 100  Temp(Src) 97.5 F (36.4 C) (Oral)  Resp 18  Ht  (1.676 m)  Wt 54.432 kg  BMI 19.38 kg/m2  SpO2 98% Physical Exam  Constitutional: He is oriented to person, place, and time. He appears well-developed and well-nourished.  HENT:  Head: Normocephalic and atraumatic.  Mouth/Throat: Uvula is midline, oropharynx is clear and moist and mucous membranes are normal. No oral lesions. No trismus in the jaw. Abnormal dentition. Dental caries present. No dental abscesses or uvula swelling. No oropharyngeal exudate, posterior oropharyngeal edema, posterior oropharyngeal erythema or tonsillar abscesses.    Multiple decaying teeth and very poor dentition noted throughout. Tooth #19 decayed down to gumline and TTP. No surrounding erythema, swelling or drainage noted, no induration or fluctuance.  Eyes: Conjunctivae and EOM are normal. Right eye exhibits no discharge. Left eye exhibits no discharge. No scleral icterus.  Neck: Normal range of motion. Neck supple.  Cardiovascular: Normal rate.   Pulmonary/Chest: Effort normal. No stridor.  Abdominal: Soft. He exhibits no distension.  Musculoskeletal: He exhibits no edema.  Lymphadenopathy:    He has no cervical adenopathy.  Neurological: He is alert and oriented to person, place, and time.  Skin: Skin is warm and dry.  Nursing note and vitals reviewed.   ED Course  Procedures (including critical care time) Labs Review Labs Reviewed - No data to display  Imaging Review No results found. I have personally reviewed and evaluated these images and lab results as part of my medical decision-making.   EKG Interpretation None      MDM   Final diagnoses:  Pain, dental     Patient with toothache.  No gross abscess.  Exam unconcerning for Ludwig's angina or spread of infection. I offered pt dental block for pain control in the ED, pt refused. Will treat with penicillin and pain medicine.  Urged patient to follow-up with dentist, pt given resources for follow up.       Satira Sarkicole Elizabeth EmersonNadeau, [place] JerseyPA-C 01/27/16 1046  Loren Raceravid Yelverton, MD 01/30/16 1754

## 2016-01-27 NOTE — Discharge Instructions (Signed)
Take your medications as prescribed. I recommend eating prior to taking your ibuprofen to prevent gastrointestinal side effects. You may also apply ice for 15-20 minutes 3-4 times to affected area to help with pain and swelling. Follow-up with one of the dental clinics listed below for further management. Return to the emergency department if symptoms worsen or new onset of fever, facial/neck swelling, unable to open her jaw, difficulty swallowing, difficulty breathing.  [facility] Community Service Learning [facility] [address] [place], [place] [address] Phone [phone]  The [employer] [facility] in [place], [place], exemplifies the [employer] vision to improve the health and quality of life of all [place]ians by Public house manager with a passion to care for the underserved and by leading the nation in community-based, service learning oral health education.  We are committed to offering comprehensive general dental services for adults, children and special needs patients in a safe, caring and professional setting.   Appointments: Our clinic is open Monday through Friday 8:00 a.m. until 5:00 p.m. The amount of time scheduled for an appointment depends on the patients specific needs. We ask that you keep your appointed time for care or provide 24-hour notice of all appointment changes. Parents or legal guardians must accompany minor children.   Payment for Services: Medicaid and other insurance plans are welcome. Payment for services is due when services are rendered and may be made by cash or credit card. If you have dental insurance, we will assist you with your claim submission.    Emergencies:  Emergency services will be provided Monday through Friday on a walk-in basis.  Please arrive early for emergency services. After hours emergency services will  be provided for patients of record as required.   Services:  Armed forces operational officer Dentistry Oral Surgery - Extractions Root Canals Sealants and Tooth Colored Fillings Crowns and Bridges Dentures and Partial Dentures Implant Services Periodontal Services and Cleanings Cosmetic Armed forces operational officer 3-D/[employer] [facility]   [employer] Guide Dental The United Ways 211 is a great source of information about community services available.  Access by dialing 2-1-1 from anywhere in [place], or by website -  PooledIncome.pl.   Other Local Resources (Updated 10/2015)  Dental  Care   Services    Phone Number and Address  Cost  [facility] For children 60 - 28 years of age:   Cleaning  Tooth brushing/flossing instruction  Sealants, fillings, crowns  Extractions  Emergency treatment  [phone] [address] [place], [place] [address] Charges based on family income.  Medicaid and some insurance plans accepted.     [facility], fillings, crowns  Extractions  Emergency treatment [phone] [address] [place], [place]  Pregnant women 28 years of age or older with a Medicaid card  [facility] - [place]  Cleaning  Sealants, fillings, crowns  Extractions  Emergency treatment [phone] [address] [place], [place] Pregnant women 81 years of age or older with a Medicaid card  [facility] - [facility] For children 49 - 82 years of age:   Cleaning  Tooth brushing/flossing instruction  Sealants, fillings, crowns  Extractions  Emergency treatment Limited orthodontic services for patients with Medicaid [phone] [address] [place], [place] [address] Medicaid and [facility] Health Choice  cover for children up to age 28 and  pregnant women.  Parents of children up to age 421 without Medicaid pay a reduced fee at time of service.  [facility] [place] For children 530 - 28 years of age:   Cleaning  Tooth brushing/flossing instruction  Sealants, fillings, crowns  Extractions  Emergency treatment Limited orthodontic services for patients with Medicaid [phone] [address] [place], [place].  Medicaid and [place] Health Choice cover for children up to age 28 and pregnant women.  Parents of children up to age 28 without Medicaid pay a reduced fee.  [facility]  Cleaning  Sealants, fillings, crowns  Extractions  Hours: Tuesdays and Thursdays, 4:15 - 8 pm [phone] [address] [place], [place] [id number] Services free of charge to [facility] residents ages 18-64 who do not have health insurance, Medicare, [place], or [place] benefits and fall within federal poverty guidelines  [facility]    Provides dental care in addition to primary medical care, nutritional counseling, and pharmacy:  Nurse, mental healthCleaning  Sealants, fillings, crowns  Extractions                  [phone] [facility], [address] [place], [place]  [phone] [facility], [address] [place], [place]  [phone] [facility] [place], [place]  [phone] [facility], [address] [place] [address] [place], [place]  [phone] [facility] [address] [place], [place] Accepts [place], [place]are, most insurance.  Also provides services available to all with fees adjusted based on ability to pay.    [facility]  Cleaning  Tooth brushing/flossing instruction  Sealants, fillings, crowns  Extractions  Emergency treatment Hours: Tuesdays, Thursdays, and Fridays from 8  am to 5 pm by appointment only. [phone] [address] [place], [place] [phone] [facility] residents with Medicaid (depending on eligibility) and children with [facility] Health Choice - call for more information.  [facility]  Extractions only  Hours: 2nd and 4th Thursday of each month from 6:30 am - 9 am.   [phone] [address] [place], [place] [address] Ages 8318 and older only.  Patients are seen on a first come, first served basis.  [employer]  [employer]  Fillings  Extractions  Orthodontics  Endodontics  Implants/Crowns/Bridges  Complete and partial dentures [phone] [place], [place] Patients must complete an application for services.  There is often a waiting list.

## 2016-01-27 NOTE — ED Notes (Signed)
Patient here with left lower broken tooth for a while. Reports developed pain with same on thursday

## 2016-03-30 ENCOUNTER — Emergency Department (HOSPITAL_BASED_OUTPATIENT_CLINIC_OR_DEPARTMENT_OTHER)
Admission: EM | Admit: 2016-03-30 | Discharge: 2016-03-30 | Disposition: A | Discharge status: Home / Self Care | Payer: No Typology Code available for payment source | Attending: Emergency Medicine | Admitting: Emergency Medicine

## 2016-03-30 ENCOUNTER — Encounter (HOSPITAL_BASED_OUTPATIENT_CLINIC_OR_DEPARTMENT_OTHER): Payer: Self-pay | Admitting: Emergency Medicine

## 2016-03-30 DIAGNOSIS — Z7982 Long term (current) use of aspirin: Secondary | ICD-10-CM | POA: Insufficient documentation

## 2016-03-30 DIAGNOSIS — K029 Dental caries, unspecified: Secondary | ICD-10-CM

## 2016-03-30 DIAGNOSIS — F17210 Nicotine dependence, cigarettes, uncomplicated: Secondary | ICD-10-CM | POA: Insufficient documentation

## 2016-03-30 DIAGNOSIS — K047 Periapical abscess without sinus: Secondary | ICD-10-CM | POA: Insufficient documentation

## 2016-03-30 MED ORDER — PENICILLIN V POTASSIUM 500 MG PO TABS: 500.0000 mg | ORAL_TABLET | Freq: Four times a day (QID) | ORAL | Status: DC

## 2016-03-30 MED ORDER — NAPROXEN 500 MG PO TABS: 500.0000 mg | ORAL_TABLET | Freq: Two times a day (BID) | ORAL | Status: DC

## 2016-03-30 MED ORDER — PENICILLIN V POTASSIUM 250 MG PO TABS
500.0000 mg | ORAL_TABLET | Freq: Once | ORAL | Status: AC
  Administered 2016-03-30: 500 mg via ORAL
  Filled 2016-03-30: qty 2

## 2016-03-30 MED ORDER — HYDROCODONE-ACETAMINOPHEN 5-325 MG PO TABS
1.0000 | ORAL_TABLET | ORAL | Status: AC
  Administered 2016-03-30: 1 via ORAL
  Filled 2016-03-30: qty 1

## 2016-03-30 NOTE — ED Notes (Signed)
Pt reports that he awoke this am with significant swelling to left jaw,

## 2016-03-30 NOTE — ED Notes (Signed)
Pt given d/c instructions as per chart. Verbalizes understanding. No questions. Rx x 2. 

## 2016-03-30 NOTE — Discharge Instructions (Signed)
Dental Caries °Dental caries is tooth decay. This decay can cause a hole in teeth (cavity) that can get bigger and deeper over time. °HOME CARE °· Brush and floss your teeth. Do this at least two times a day. °· Use a fluoride toothpaste. °· Use a mouth rinse if told by your dentist or doctor. °· Eat less sugary and starchy foods. Drink less sugary drinks. °· Avoid snacking often on sugary and starchy foods. Avoid sipping often on sugary drinks. °· Keep regular checkups and cleanings with your dentist. °· Use fluoride supplements if told by your dentist or doctor. °· Allow fluoride to be applied to teeth if told by your dentist or doctor. °  °This information is not intended to replace advice given to you by your health care provider. Make sure you discuss any questions you have with your health care provider. °  °Document Released: 07/09/2008 Document Revised: 10/21/2014 Document Reviewed: 10/02/2012 °Elsevier Interactive Patient Education ©2016 Elsevier Inc. °Community Resource Guide Dental °The United Way’s “211” is a great source of information about community services available.  Access by dialing 2-1-1 from anywhere in [place], or by website -  www.nc211.org.  ° °Other Local Resources (Updated 10/2015) ° °Dental  Care °  °Services ° °  °Phone Number and Address  °Cost  °[facility] For children 0 - 21 years of age:  °• Cleaning °• Tooth brushing/flossing instruction °• Sealants, fillings, crowns °• Extractions °• Emergency treatment  [phone] °[address] °[place], [place] [address] Charges based on family income.  Medicaid and some insurance plans accepted.   °  °[facility] [place] • Cleaning °• Sealants, fillings, crowns °• Extractions °• Emergency treatment [phone] °[address] °[place], [place] ° Pregnant women 18 years of age or older with a Medicaid card  °[facility] [place] • Cleaning °• Sealants, fillings, crowns °• Extractions °• Emergency treatment [phone] °[address] °[place], [place] Pregnant women 18 years of age or older with a Medicaid card  °[facility] For children 0 - 21 years of age:  °• Cleaning °• Tooth brushing/flossing instruction °• Sealants, fillings, crowns °• Extractions °• Emergency treatment °Limited orthodontic services for patients with Medicaid [phone] °[address] °[place], [place] [address] Medicaid and [place] Health Choice cover for children up to age 21 and pregnant women.  Parents of children up to age 21 without Medicaid pay a reduced fee at time of service.  °[facility] [place] For children 0 - 21 years of age:  °• Cleaning °• Tooth brushing/flossing instruction °• Sealants, fillings, crowns °• Extractions °• Emergency treatment °Limited orthodontic services for patients with Medicaid [phone] °[address] °[place], [place].  Medicaid and [place] Health Choice cover for children up to age 21 and pregnant women.  Parents of children up to age 21 without Medicaid pay a reduced fee.  °[facility] • Cleaning °• Sealants, fillings, crowns °• Extractions ° °Hours: Tuesdays and Thursdays, 4:15 - 8 pm [phone] °[address] °[place], [place] [address] Services free of charge to [place] residents ages 18-64 who do not have health insurance, Medicare, Medicaid, or VA benefits and fall within federal poverty guidelines  °[facility] ° ° ° Provides dental care in addition to primary medical care, nutritional counseling,   and pharmacy: °• Cleaning °• Sealants, fillings, crowns °• Extractions ° ° ° ° ° ° ° ° ° ° ° ° ° ° ° ° ° [phone] °[facility], [address] °[place], [place] ° °[phone] °[facility], [address] [place], [place] ° °[phone] °[facility] °[place], [place] ° °[phone] °[facility], [address] °[place], [place] ° °[phone] °[facility] °[address] °[place], [place] Accepts Medicaid, Medicare, most insurance.  Also provides services available to all with fees adjusted based on ability to pay.    °[facility] • Cleaning °• Tooth brushing/flossing instruction °• Sealants, fillings, crowns °• Extractions °• Emergency treatment °Hours: Tuesdays, Thursdays, and Fridays from 8 am to 5 pm by appointment only. [phone] °[address] °[place], [place] [address] [place] residents with Medicaid (depending on eligibility) and children with [place] Health Choice - call for more information.  °[facility] • Extractions only ° °Hours: 2nd and 4th Thursday of each month from 6:30 am - 9 am.   [phone]. [phone] °[address] °[place], [place] [address] Ages 18 and older only.  Patients are seen on a first come, first served basis.  °[facility] • Cleanings °• Fillings °• Extractions °• Orthodontics °• Endodontics °• Implants/Crowns/Bridges °• Complete and partial dentures [phone] °[place],  Patients must complete an application for services.  There is often a waiting list.   ° °

## 2016-03-30 NOTE — ED Notes (Signed)
Pt states he has had problems with his teeth for awhile, but has not had the time or money to do anything about it. Left side facial swelling noted yesterday. Dental caries noted to left lower jaw where swelling is located. Poor dental hygiene.

## 2016-03-30 NOTE — ED Provider Notes (Signed)
CSN: [id number]     Arrival date & time 03/30/16  1942 History  By signing my name below, I, Bethel BornBritney McCollum, attest that this documentation has been prepared under the direction and in the presence of Linwood DibblesJon Jahnavi Muratore, MD. Electronically Signed: Bethel BornBritney McCollum, ED Scribe. 03/30/2016. 8:43 PM   Chief Complaint  Patient presents with  . Facial Swelling    The history is provided by the patient. No language interpreter was used.   Webb SilversmithSteven M Coba is a 30 y.o. male who presents to the Emergency Department complaining of constant, 10/10 in severity,  lower left dental pain and facial swelling with onset this morning upon waking.  Pt denies fever and trouble swallowing. He does not have a Education officer, communitydentist.   Past Medical History  Diagnosis Date  . Seasonal allergies   . Drug abuse    History reviewed. No pertinent past surgical history. Family History  Problem Relation Age of Onset  . Diabetes Mother   . Hyperlipidemia Mother   . Hypertension Mother   . Hypertension Brother   . Heart attack Neg Hx   . Sudden death Neg Hx    Social History  Substance Use Topics  . Smoking status: Current Every Day Smoker -- 1.00 packs/day    Types: Cigarettes  . Smokeless tobacco: None  . Alcohol Use: No    Review of Systems  Constitutional: Negative for fever.  HENT: Positive for dental problem and facial swelling. Negative for trouble swallowing.   Gastrointestinal: Negative for nausea and vomiting.  All other systems reviewed and are negative.  Allergies  Review of patient's allergies indicates no known allergies.  Home Medications   Prior to Admission medications   Medication Sig Start Date End Date Taking? Authorizing Provider  Aspirin-Caffeine (BC FAST PAIN RELIEF PO) Take by mouth.   Yes Historical Provider, MD  naproxen (NAPROSYN) 500 MG tablet Take 1 tablet (500 mg total) by mouth 2 (two) times daily. 03/30/16   Linwood DibblesJon Sherle Mello, MD  penicillin v potassium (VEETID) 500 MG tablet Take 1 tablet (500 mg  total) by mouth 4 (four) times daily. 03/30/16   Linwood DibblesJon Yazleemar Strassner, MD   BP 144/106 mmHg  Pulse 106  Temp(Src) 97.7 F (36.5 C) (Oral)  Resp 16  Ht 5\' 5"  (1.651 m)  Wt 125 lb (56.7 kg)  BMI 20.80 kg/m2  SpO2 98% Physical Exam  Constitutional: He appears well-developed and well-nourished. No distress.  HENT:  Head: Normocephalic and atraumatic.  Right Ear: External ear normal.  Left Ear: External ear normal.  Mouth/Throat: Abnormal dentition. Dental abscesses and dental caries present.    Left lower molars decayed to the gums, no erythema, subamandibular lymphadenopathy and ttp  Eyes: Conjunctivae are normal. Right eye exhibits no discharge. Left eye exhibits no discharge. No scleral icterus.  Neck: Neck supple. No tracheal deviation present.  Cardiovascular: Normal rate.   Pulmonary/Chest: Effort normal. No stridor. No respiratory distress.  Musculoskeletal: He exhibits no edema.  Neurological: He is alert. Cranial nerve deficit: no gross deficits.  Skin: Skin is warm and dry. No rash noted.  Psychiatric: He has a normal mood and affect.  Nursing note and vitals reviewed.   ED Course  Procedures (including critical care time) DIAGNOSTIC STUDIES: Oxygen Saturation is 98% on RA,  normal by my interpretation.    COORDINATION OF CARE: 8:41 PM Discussed treatment plan which includes pain medication and abx with pt at bedside and pt agreed to plan.    MDM   Final diagnoses:  Dental caries    Symptoms are consistent with a tooth infection/abscess. . He has mild facial swelling. At this time there does not appear to be any evidence of an emergency medical condition. I will refer him to a dentist/oral surgeon. Patient will be given prescriptions  for pain medications and antibiotics.    I personally performed the services described in this documentation, which was scribed in my presence.  The recorded information has been reviewed and is accurate.    Linwood Dibbles, MD 03/30/16  [phone]

## 2017-10-12 ENCOUNTER — Encounter (HOSPITAL_BASED_OUTPATIENT_CLINIC_OR_DEPARTMENT_OTHER): Payer: Self-pay | Admitting: Emergency Medicine

## 2017-10-12 ENCOUNTER — Other Ambulatory Visit: Payer: Self-pay

## 2017-10-12 ENCOUNTER — Emergency Department (HOSPITAL_BASED_OUTPATIENT_CLINIC_OR_DEPARTMENT_OTHER)
Admission: EM | Admit: 2017-10-12 | Discharge: 2017-10-12 | Disposition: A | Discharge status: Home / Self Care | Payer: Self-pay | Attending: Emergency Medicine | Admitting: Emergency Medicine

## 2017-10-12 DIAGNOSIS — F17210 Nicotine dependence, cigarettes, uncomplicated: Secondary | ICD-10-CM | POA: Insufficient documentation

## 2017-10-12 DIAGNOSIS — K0889 Other specified disorders of teeth and supporting structures: Secondary | ICD-10-CM | POA: Insufficient documentation

## 2017-10-12 MED ORDER — PENICILLIN V POTASSIUM 500 MG PO TABS: 500.0000 mg | ORAL_TABLET | Freq: Four times a day (QID) | ORAL | 0 refills | Status: AC

## 2017-10-12 NOTE — ED Provider Notes (Signed)
Emergency Department Provider Note   I have reviewed the triage vital signs and the nursing notes.   HISTORY  Chief Complaint Dental Pain   HPI Darius Kennedy is a 31 y.o. male to the emergency department for evaluation of dental pain.  He has multiple areas of dental decay but his pain is to the left upper gum.  He has some swelling on the outside of his face.  Patient is requesting antibiotics which has improved his symptoms in the past. No radiation of symptoms. No modifying factors. Pain is moderate and constant.    Past Medical History:  Diagnosis Date  . Drug abuse (HCC)   . Seasonal allergies     Patient Active Problem List   Diagnosis Date Noted  . Fracture of metacarpal neck of left hand, closed 03/23/2013    History reviewed. No pertinent surgical history.  Current Outpatient Rx  . Order #: [id number] Class: Historical Med  . Order #: [id number] Class: Print  . Order #: [id number] Class: Print    Allergies Patient has no known allergies.  Family History  Problem Relation Age of Onset  . Diabetes Mother   . Hyperlipidemia Mother   . Hypertension Mother   . Hypertension Brother   . Heart attack Neg Hx   . Sudden death Neg Hx     Social History Social History   Tobacco Use  . Smoking status: Current Every Day Smoker    Packs/day: 1.00    Types: Cigarettes  . Smokeless tobacco: Never Used  Substance Use Topics  . Alcohol use: No  . Drug use: Yes    Frequency: 7.0 times per week    Types: Marijuana    Comment: crystal meth    Review of Systems  Constitutional: No fever/chills Eyes: No visual changes. ENT: No sore throat. Positive dental pain.  Cardiovascular: Denies chest pain. Respiratory: Denies shortness of breath. Gastrointestinal: No abdominal pain.  No nausea, no vomiting.  No diarrhea.  No constipation. Genitourinary: Negative for dysuria. Musculoskeletal: Negative for back pain. Skin: Negative for rash. Neurological: Negative for  headaches, focal weakness or numbness.  10-point ROS otherwise negative.  ____________________________________________   PHYSICAL EXAM:  VITAL SIGNS: ED Triage Vitals  Enc Vitals Group     BP 10/12/17 1328 (!) 133/103     Pulse Rate 10/12/17 1328 65     Resp 10/12/17 1328 18     Temp 10/12/17 1328 98.2 F (36.8 C)     Temp Source 10/12/17 1328 Oral     SpO2 10/12/17 1328 99 %     Weight 10/12/17 1327 120 lb (54.4 kg)     Height 10/12/17 1327 5\' 8"  (1.727 m)     Pain Score 10/12/17 1327 7   Constitutional: Alert and oriented. Well appearing and in no acute distress. Eyes: Conjunctivae are normal. Head: Atraumatic. Nose: No congestion/rhinnorhea. Mouth/Throat: Mucous membranes are moist.  Oropharynx non-erythematous. Diffusely poor dentition. No clear dental abscess. Some left cheek swelling without fluctuance. No trismus. Submandibular compartment is soft.  Neck: No stridor.  Cardiovascular: Normal rate, regular rhythm. Good peripheral circulation. Grossly normal heart sounds.   Respiratory: Normal respiratory effort.  No retractions. Lungs CTAB. Gastrointestinal: Soft and nontender. No distention.  Musculoskeletal: No lower extremity tenderness nor edema. No gross deformities of extremities. Neurologic:  Normal speech and language. No gross focal neurologic deficits are appreciated.  Skin:  Skin is warm, dry and intact. No rash noted.  ____________________________________________  RADIOLOGY  None ____________________________________________   PROCEDURES  Procedure(s) performed:   Procedures  None ____________________________________________   INITIAL IMPRESSION / ASSESSMENT AND PLAN / ED COURSE  Pertinent labs & imaging results that were available during my care of the patient were reviewed by me and considered in my medical decision making (see chart for details).  Patient with diffusely poor dentition. No clear abscess. No evidence of deep-space neck/face  infection. Patient not asking for pain medication. States that symptoms have improved in the past with abx alone. Advised he take Tylenol/Motrin for pain. Discussed low-cost dental options including the dental [facility] [place]. Provided this information in written form at discharge. Provided abx for possible periapical abscess and advised calling tomorrow to establish care with dentist.   At this time, I do not feel there is any life-threatening condition present. I have reviewed and discussed all results (EKG, imaging, lab, urine as appropriate), exam findings with patient. I have reviewed nursing notes and appropriate previous records.  I feel the patient is safe to be discharged home without further emergent workup. Discussed usual and customary return precautions. Patient and family (if present) verbalize understanding and are comfortable with this plan.  Patient will follow-up with their primary care provider. If they do not have a primary care provider, information for follow-up has been provided to them. All questions have been answered.  ____________________________________________  FINAL CLINICAL IMPRESSION(S) / ED DIAGNOSES  Final diagnoses:  Pain, dental     MEDICATIONS GIVEN DURING THIS VISIT:  None  NEW OUTPATIENT MEDICATIONS STARTED DURING THIS VISIT:  Penicillin   Note:  This document was prepared using Dragon voice recognition software and may include unintentional dictation errors.  Alona BeneJoshua Long, MD Emergency Medicine    Long, Arlyss RepressJoshua G, MD 10/13/17 1140

## 2017-10-12 NOTE — Discharge Instructions (Signed)
Return to the emergency room for fever, change in vision, redness to the face that rapidly spreads towards the eye, nausea or vomiting, difficulty swallowing or shortness of breath. °  °Apply warm compresses to jaw throughout the day.  ° °Followup with a dentist is very important for ongoing evaluation and management of recurrent dental pain. Return to emergency department for emergent changing or worsening symptoms." ° °Low-cost dental clinic: °**David  Civils  at [phone]**  ° °You may also call [phone] ° °Dental Assistance °If the dentist on-call cannot see you, please use the resources below: ° ° °Patients with Medicaid: [facility] °[address], [phone] °[address], [phone] ° °If unable to pay, or uninsured, contact [facility] ([phone]) or [facility] ([phone] in [employer], [phone] in [place]) to become qualified for the adult dental clinic ° °Other Low-Cost Community Dental Services: °[facility]- [address], [place], [place], [address] °   [phone], Ext. [phone] °   2nd and 4th Thursday of the month at 6:30am °   10 clients each day by appointment, can sometimes see walk-in     patients if someone does not show for an appointment °[facility]- [address], [place], [place], [address] °   [phone] °[facility]- [address], [place], , [address] °   [phone] ° °[facility]- [phone] °[facility]- [phone] °[facility]- [phone]  °

## 2017-10-12 NOTE — ED Triage Notes (Signed)
Patient reports swelling to left cheek and gum for the past 3 days.  Reports dental decay with multiple fractured teeth.

## 2020-01-14 ENCOUNTER — Emergency Department (HOSPITAL_BASED_OUTPATIENT_CLINIC_OR_DEPARTMENT_OTHER)
Admission: EM | Admit: 2020-01-14 | Discharge: 2020-01-14 | Discharge status: Against Medical Advice | Payer: Self-pay | Attending: Emergency Medicine | Admitting: Emergency Medicine

## 2020-01-14 ENCOUNTER — Emergency Department (HOSPITAL_BASED_OUTPATIENT_CLINIC_OR_DEPARTMENT_OTHER): Payer: Self-pay

## 2020-01-14 ENCOUNTER — Other Ambulatory Visit: Payer: Self-pay

## 2020-01-14 ENCOUNTER — Encounter (HOSPITAL_BASED_OUTPATIENT_CLINIC_OR_DEPARTMENT_OTHER): Payer: Self-pay | Admitting: Emergency Medicine

## 2020-01-14 DIAGNOSIS — F17210 Nicotine dependence, cigarettes, uncomplicated: Secondary | ICD-10-CM | POA: Insufficient documentation

## 2020-01-14 DIAGNOSIS — L0889 Other specified local infections of the skin and subcutaneous tissue: Secondary | ICD-10-CM | POA: Insufficient documentation

## 2020-01-14 DIAGNOSIS — L089 Local infection of the skin and subcutaneous tissue, unspecified: Secondary | ICD-10-CM

## 2020-01-14 MED ORDER — SODIUM CHLORIDE 0.9 % IV BOLUS: 1000.0000 mL | Freq: Once | INTRAVENOUS | Status: DC

## 2020-01-14 MED ORDER — ONDANSETRON HCL 4 MG/2ML IJ SOLN
4.0000 mg | Freq: Once | INTRAMUSCULAR | Status: DC
  Filled 2020-01-14: qty 2

## 2020-01-14 MED ORDER — CLINDAMYCIN HCL 150 MG PO CAPS: 450.0000 mg | ORAL_CAPSULE | Freq: Three times a day (TID) | ORAL | 0 refills | Status: AC

## 2020-01-14 MED ORDER — ONDANSETRON 4 MG PO TBDP: 4.0000 mg | ORAL_TABLET | Freq: Three times a day (TID) | ORAL | 0 refills | Status: DC | PRN

## 2020-01-14 NOTE — ED Notes (Addendum)
Pt pacing, refuses to allow blood draw or medications. Provider made aware. Encouraged to complete exam, bloodwork and imaging, pt refuses all states wishes to leave.

## 2020-01-14 NOTE — Discharge Instructions (Addendum)
You were seen in the emergency department today for right-sided facial swelling.  We have recommended you stay in the emergency department for labs and a CT scan of your face which you have refused.  You are signing out AGAINST MEDICAL ADVICE.  We are providing you with prescriptions for clindamycin- an antibiotic to take three times per day for 10 days for infection as well as Zofran to take every 8 hours as needed for nausea/vomiting.   Please take all of your antibiotics until finished. You may develop abdominal discomfort or diarrhea from the antibiotic.  You may help offset this with probiotics which you can buy at the store (ask your pharmacist if unable to find) or get probiotics in the form of eating yogurt. Do not eat or take the probiotics until 2 hours after your antibiotic. If you are unable to tolerate these side effects follow-up with your primary care provider or return to the emergency department.   If you begin to experience any blistering, rashes, swelling, or difficulty breathing seek medical care for evaluation of potentially more serious side effects.   Please be aware that these medications may interact with other medications you are taking, please be sure to discuss your medication list with your pharmacist.   Please return to the emergency department at any time should you wish to have further evaluation or treatment. Return immediately for new or worsening symptoms including but not limited to increased pain/redness/swelling, temperature greater than 100.4, inability to swallow, inability to open your mouth, trouble with neck movement, trouble breathing, or any other concerns.
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
## 2021-02-06 ENCOUNTER — Emergency Department (HOSPITAL_BASED_OUTPATIENT_CLINIC_OR_DEPARTMENT_OTHER)
Admission: EM | Admit: 2021-02-06 | Discharge: 2021-02-06 | Disposition: A | Discharge status: Home / Self Care | Payer: Self-pay | Attending: Emergency Medicine | Admitting: Emergency Medicine

## 2021-02-06 ENCOUNTER — Encounter (HOSPITAL_BASED_OUTPATIENT_CLINIC_OR_DEPARTMENT_OTHER): Payer: Self-pay

## 2021-02-06 ENCOUNTER — Other Ambulatory Visit: Payer: Self-pay

## 2021-02-06 DIAGNOSIS — F17210 Nicotine dependence, cigarettes, uncomplicated: Secondary | ICD-10-CM | POA: Insufficient documentation

## 2021-02-06 DIAGNOSIS — K047 Periapical abscess without sinus: Secondary | ICD-10-CM | POA: Insufficient documentation

## 2021-02-06 DIAGNOSIS — R1110 Vomiting, unspecified: Secondary | ICD-10-CM | POA: Insufficient documentation

## 2021-02-06 MED ORDER — CLINDAMYCIN HCL 300 MG PO CAPS: 300.0000 mg | ORAL_CAPSULE | Freq: Three times a day (TID) | ORAL | 0 refills | Status: AC

## 2021-02-06 MED ORDER — ONDANSETRON 4 MG PO TBDP: 4.0000 mg | ORAL_TABLET | Freq: Three times a day (TID) | ORAL | 0 refills | Status: DC | PRN

## 2021-02-06 MED ORDER — ONDANSETRON 4 MG PO TBDP
4.0000 mg | ORAL_TABLET | Freq: Once | ORAL | Status: AC
  Administered 2021-02-06: 4 mg via ORAL
  Filled 2021-02-06: qty 1

## 2021-02-06 NOTE — ED Provider Notes (Signed)
[facility] EMERGENCY DEPARTMENT Provider Note   CSN: [id number] Arrival date & time: 02/06/21  0944     History Chief Complaint  Patient presents with  . Dental Pain    ATA [name] is a 35 y.o. male.  35 year old male with past medical history of substance abuse and poor dentition presents with complaint of right lower dental pain and swelling x2 days.  Denies trauma, fever, drainage.  States that he did have vomiting this morning after taking leftover antibiotics.  Patient is aware of his poor dental hygiene and requests referral to oral surgery for dental implants.        Past Medical History:  Diagnosis Date  . Drug abuse (HCC)   . Seasonal allergies     Patient Active Problem List   Diagnosis Date Noted  . Fracture of metacarpal neck of left hand, closed 03/23/2013    History reviewed. No pertinent surgical history.     Family History  Problem Relation Age of Onset  . Diabetes Mother   . Hyperlipidemia Mother   . Hypertension Mother   . Hypertension Brother   . Heart attack Neg Hx   . Sudden death Neg Hx     Social History   Tobacco Use  . Smoking status: Current Every Day Smoker    Packs/day: 1.00    Types: Cigarettes  . Smokeless tobacco: Never Used  Substance Use Topics  . Alcohol use: No  . Drug use: Yes    Frequency: 7.0 times per week    Types: Marijuana    Comment: crystal meth    Home Medications Prior to Admission medications   Medication Sig Start Date End Date Taking? Authorizing Provider  Aspirin-Caffeine (BC FAST PAIN RELIEF PO) Take by mouth.    [provider]  clindamycin (CLEOCIN) 300 MG capsule Take 1 capsule (300 mg total) by mouth 3 (three) times daily for 10 days. 02/06/21 02/16/21 Yes Jeannie Fend, PA-C  naproxen (NAPROSYN) 500 MG tablet Take 1 tablet (500 mg total) by mouth 2 (two) times daily. 03/30/16   Linwood Dibbles, MD  ondansetron (ZOFRAN ODT) 4 MG disintegrating tablet Take 1 tablet (4 mg total)  by mouth every 8 (eight) hours as needed for nausea or vomiting. 02/06/21   Jeannie Fend, PA-C    Allergies    Patient has no known allergies.  Review of Systems   Review of Systems  Constitutional: Negative for fever.  HENT: Positive for dental problem. Negative for ear pain and facial swelling.   Gastrointestinal: Positive for nausea and vomiting. Negative for abdominal pain.  Musculoskeletal: Negative for neck pain and neck stiffness.  Skin: Negative for rash and wound.  Allergic/Immunologic: Negative for immunocompromised state.  Neurological: Negative for weakness and headaches.  Hematological: Negative for adenopathy.    Physical Exam Updated Vital Signs BP (!) 136/91 (BP Location: Right Arm)   Pulse 98   Temp 97.9 F (36.6 C) (Oral)   Resp 16   Ht 5' 5.5" (1.664 m)   Wt 55.8 kg   SpO2 99%   BMI 20.16 kg/m   Physical Exam Vitals and nursing note reviewed.  Constitutional:      General: He is not in acute distress.    Appearance: He is well-developed. He is not diaphoretic.  HENT:     Head: Normocephalic and atraumatic.     Jaw: No trismus.     Mouth/Throat:     Mouth: Mucous membranes are moist.  Comments: Right lower dental abscess, severe decay to all teeth. No drainage from the abscess.  Eyes:     Conjunctiva/sclera: Conjunctivae normal.  Pulmonary:     Effort: Pulmonary effort is normal.  Musculoskeletal:     Cervical back: Neck supple.  Lymphadenopathy:     Cervical: No cervical adenopathy.  Skin:    General: Skin is warm and dry.     Findings: No erythema or rash.  Neurological:     Mental Status: He is alert and oriented to person, place, and time.  Psychiatric:        Behavior: Behavior normal.     ED Results / Procedures / Treatments   Labs (all labs ordered are listed, but only abnormal results are displayed) Labs Reviewed - No data to display  EKG None  Radiology No results found.  Procedures Procedures   Medications  Ordered in ED Medications  ondansetron (ZOFRAN-ODT) disintegrating tablet 4 mg (4 mg Oral Given 02/06/21 1020)    ED Course  I have reviewed the triage vital signs and the nursing notes.  Pertinent labs & imaging results that were available during my care of the patient were reviewed by me and considered in my medical decision making (see chart for details).  Clinical Course as of 02/06/21 1037  Tue Feb 06, 2021  1944 35 year old male with right lower dental abscess x2 as well as vomiting. On exam has right lower dental abscess without overlying facial cellulitis or facial swelling, no trismus.  Severe decay noted to all teeth.  Discussed with patient importance of follow-up with oral surgery before he develops worsening infection.  He will be started on clindamycin, given Zofran for his vomiting and referral given as well as dental resource list. [LM]    Clinical Course User Index [LM] Darius Kennedy   MDM Rules/Calculators/A&P                          Final Clinical Impression(s) / ED Diagnoses Final diagnoses:  Dental abscess    Rx / DC Orders ED Discharge Orders         Ordered    clindamycin (CLEOCIN) 300 MG capsule  3 times daily        02/06/21 1013    ondansetron (ZOFRAN ODT) 4 MG disintegrating tablet  Every 8 hours PRN        02/06/21 1015           Jeannie Fend, PA-C 02/06/21 1037    Long, Arlyss Repress, MD 02/09/21 [phone]

## 2021-02-06 NOTE — ED Notes (Signed)
Pt vomiting during triage.

## 2021-02-06 NOTE — Discharge Instructions (Signed)
Take Clindamycin as prescribed and complete the full course. Follow up with oral surgery, referral given.

## 2021-02-06 NOTE — ED Triage Notes (Signed)
Pt arrives with right lower dental abscess. Poor dental hygiene. Doesn't see a dentist. States started 2 days ago, worse last night. Took a leftover antibiotic pill. C/o nausea.

## 2021-12-17 ENCOUNTER — Ambulatory Visit: Payer: Self-pay

## 2021-12-17 NOTE — Telephone Encounter (Signed)
? ? ? ?  Chief Complaint: Mother states pt. Has had "mental health issues for 5 years with anger." ?Symptoms: Has not been seen for this. Given [facility] phone number. ?Frequency: 5 years ago ?Pertinent Negatives: Patient denies any physical aggression. ?Disposition: [] ED /[] Urgent Care (no appt availability in office) / [] Appointment(In office/virtual)/ []  [facility]/ [] Home Care/ [] Refused Recommended Disposition /[] [facility]/ []  Follow-up with PCP ?Additional Notes: Asking to be seen sooner as new pt. Please advise.  ? ?Answer Assessment - Initial Assessment Questions ?1. CONCERN: "What happened that made you call today?" ?    Mother states pt. Is angry  ?2. BIPOLAR SYMPTOM SCREENING: "How are you feeling overall, is your bipolar disorder under good control?" ?  - MANIA SYMPTOMS (e.g., increased energy, decreased sleeping, hyperactivity, grandiosity)  ?  - DEPRESSION SYMPTOMS (e.g., decreased energy, increased sleeping or difficulty sleeping, difficulty concentrating, feelings of sadness, guilt, hopelessness, or worthlessness) ?    No ?3. RISK OF HARM - SUICIDAL IDEATION:  "Do you ever have thoughts of hurting or killing yourself?"  (e.g., yes, no, no but preoccupation with thoughts about death) ?  - INTENT:  "Do you have thoughts of hurting or killing yourself right NOW?" (e.g., yes, no, N/A) ?  - PLAN: "Do you have a specific plan for how you would do this?" (e.g., gun, knife, overdose, no plan, N/A) ?    No ?4. RISK OF HARM - HOMICIDAL IDEATION:  "Do you ever have thoughts of hurting or killing someone else?"  (e.g., yes, no, no but preoccupation with thoughts about death) ?  - INTENT:  "Do you have thoughts of hurting or killing someone right NOW?" (e.g., yes, no, N/A) ?  - PLAN: "Do you have a specific plan for how you would do this?" (e.g., gun, knife, no plan, N/A)  ?    No ?5. FUNCTIONAL IMPAIRMENT: "How have things been going for you overall? Have you had more  difficulty than usual doing your normal daily activities?"  (e.g., better, same, worse; self-care, school, work, interactions) ?    Angry with parents ?6. SUPPORT: "Who is with you now?" "Who do you live with?" "Do you have family or friends who you can talk to?"  ?    Family ?7. THERAPIST: "Do you have a counselor or therapist? Name?" ?    No ?8. STRESSORS: "Has there been any new stress or recent changes in your life?" ?    N/a ?9. ALCOHOL USE OR SUBSTANCE USE (DRUG USE): "Do you drink alcohol or use any illegal drugs?" ?    No ?10. OTHER: "Do you have any other physical symptoms right now?" (e.g., fever) ?      No ?11. PREGNANCY: "Is there any chance you are pregnant?" "When was your last menstrual period?" ?      N/a ? ?Protocols used: Bipolar Disorder (Manic Depression)-A-AH ? ?

## 2021-12-17 NOTE — Telephone Encounter (Signed)
Advised of the mobile medicine unit and hours of operation.  ? ?She has information to [facility] on [employer].  ?

## 2022-03-22 ENCOUNTER — Ambulatory Visit: Payer: Self-pay

## 2022-03-22 NOTE — Telephone Encounter (Signed)
Summary: malnourished/possible mental health issues   Pt mother stated pt isn't eating that well. Pt is very malnourished, possibly weighing 95-100 pounds. Pt talks to himself, arguing with someone who isn't there. Pt mother stated they are worried about him .    Office canceled the appointment because Dr.Patrick Delford Field would be out of office. I rescheduled pt for 06/18/2022.   Pt mother seeking clinical advice.         Chief Complaint: Hallucinations, weight loss Symptoms: Above Frequency: Mother states "been going on for years" Pertinent Negatives: Patient denies self harm Disposition: [x] ED /[] Urgent Care (no appt availability in office) / [] Appointment(In office/virtual)/ []  [place] [facility]/ [] Home Care/ [] Refused Recommended Disposition /[] [facility]/ []  Follow-up with PCP Additional Notes: Mother states pt. Refuses ED. Has information for [facility] in [place]. Willtake pt. There.  Reason for Disposition  Very strange or paranoid behavior  Answer Assessment - Initial Assessment Questions 1. LEVEL OF CONSCIOUSNESS: "How is he (she, the patient) acting right now?" (e.g., alert-oriented, confused, lethargic, stuporous, comatose)     Alert 2. ONSET: "When did the confusion start?"  (minutes, hours, days)     Years ago 3. PATTERN "Does this come and go, or has it been constant since it started?"  "Is it present now?"     Comes and goes 4. ALCOHOL or DRUGS: "Has he been drinking alcohol or taking any drugs?"      "Crystal Meth" 5. NARCOTIC MEDICATIONS: "Has he been receiving any narcotic medications?" (e.g., morphine, Vicodin)     No 6. CAUSE: "What do you think is causing the confusion?"      Unsure 7. OTHER SYMPTOMS: "Are there any other symptoms?" (e.g., difficulty breathing, headache, fever, weakness)     Weight loss  Protocols used: Confusion - Delirium-A-AH

## 2022-04-03 ENCOUNTER — Ambulatory Visit: Payer: Self-pay | Admitting: Critical Care Medicine

## 2022-06-18 ENCOUNTER — Ambulatory Visit: Payer: Medicaid Other | Attending: Critical Care Medicine | Admitting: Family Medicine

## 2022-06-18 ENCOUNTER — Encounter: Payer: Self-pay | Admitting: Family Medicine

## 2022-06-18 VITALS — BP 143/95 | HR 82 | Temp 98.3°F | Ht 66.5 in | Wt 103.8 lb

## 2022-06-18 DIAGNOSIS — L089 Local infection of the skin and subcutaneous tissue, unspecified: Secondary | ICD-10-CM

## 2022-06-18 DIAGNOSIS — R636 Underweight: Secondary | ICD-10-CM
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
## 2022-06-18 NOTE — Progress Notes (Signed)
Subjective:  Patient ID: Darius Kennedy, male    DOB: [birth year]  Age: 36 y.o. MRN: [id number]  CC: New Patient (Initial Visit)   HPI Darius Kennedy is a 36 y.o. year old male who presents today to establish care.  Interval History: Today he informs me he does not know why he is here in the clinic. He has had a bump on the left side of his nose for a couple of years Which drains clear fluid intermittently but is not tender.  He goes on to say he has cellulitis and that he possibly has appendicitis.  He denies presence of fever and on further questioning he informs me he does not know the answers to those questions.  He states he has lost weight but is unable to quantify how much. He is a difficult historian and goes off in tangents. States he received a medication for his cellulitis but I do not see that in his chart.  Also states he was treated for urinary tract infection. His blood pressure is elevated and he has no known history of hypertension. Past Medical History:  Diagnosis Date   Drug abuse (HCC)    Seasonal allergies     No past surgical history on file.  Family History  Problem Relation Age of Onset   Diabetes Mother    Hyperlipidemia Mother    Hypertension Mother    Hypertension Brother    Heart attack Neg Hx    Sudden death Neg Hx     Social History   Socioeconomic History   Marital status: Single    Spouse name: Not on file   Number of children: Not on file   Years of education: Not on file   Highest education level: Not on file  Occupational History   Not on file  Tobacco Use   Smoking status: Every Day    Packs/day: 1.00    Types: Cigarettes   Smokeless tobacco: Never  Substance and Sexual Activity   Alcohol use: No   Drug use: Yes    Frequency: 7.0 times per week    Types: Marijuana    Comment: crystal meth   Sexual activity: Not on file  Other Topics Concern   Not on file  Social History Narrative   Not on file   Social Determinants  of Health   Financial Resource Strain: Not on file  Food Insecurity: Not on file  Transportation Needs: Not on file  Physical Activity: Not on file  Stress: Not on file  Social Connections: Not on file    No Known Allergies  Outpatient Medications Prior to Visit  Medication Sig Dispense Refill   Aspirin-Caffeine (BC FAST PAIN RELIEF PO) Take by mouth. (Patient not taking: Reported on 06/18/2022)     naproxen (NAPROSYN) 500 MG tablet Take 1 tablet (500 mg total) by mouth 2 (two) times daily. (Patient not taking: Reported on 06/18/2022) 30 tablet 0   ondansetron (ZOFRAN ODT) 4 MG disintegrating tablet Take 1 tablet (4 mg total) by mouth every 8 (eight) hours as needed for nausea or vomiting. (Patient not taking: Reported on 06/18/2022) 12 tablet 0   No facility-administered medications prior to visit.     ROS Review of Systems  Constitutional:  Negative for activity change and appetite change.  HENT:  Negative for sinus pressure and sore throat.   Respiratory:  Negative for chest tightness, shortness of breath and wheezing.   Cardiovascular:  Negative for chest pain and palpitations.  Gastrointestinal:  Negative for abdominal distention, abdominal pain and constipation.  Genitourinary: Negative.   Musculoskeletal: Negative.   Skin:        See HPI  Psychiatric/Behavioral:  Negative for behavioral problems and dysphoric mood.     Objective:  BP (!) 143/95   Pulse 82   Temp 98.3 F (36.8 C) (Oral)   Ht 5' 6.5" (1.689 m)   Wt 103 lb 12.8 oz (47.1 kg)   SpO2 99%   BMI 16.50 kg/m      06/18/2022    2:07 PM 02/06/2021    9:56 AM 01/14/2020    2:46 PM  BP/Weight  Systolic BP 143 136   Diastolic BP 95 91   Wt. (Lbs) 103.8 123 107  BMI 16.5 kg/m2 20.16 kg/m2 17.27 kg/m2      Physical Exam Constitutional:      Appearance: He is well-developed.     Comments: Underweight  Cardiovascular:     Rate and Rhythm: Normal rate.     Heart sounds: Normal heart sounds. No murmur  heard. Pulmonary:     Effort: Pulmonary effort is normal.     Breath sounds: Normal breath sounds. No wheezing or rales.  Chest:     Chest wall: No tenderness.  Abdominal:     General: Bowel sounds are normal. There is no distension.     Palpations: Abdomen is soft. There is no mass.     Tenderness: There is no abdominal tenderness.  Musculoskeletal:        General: Normal range of motion.     Right lower leg: No edema.     Left lower leg: No edema.  Skin:    Comments: Rash on face with soft nontender lesion on on the left border of nasal bridge  Neurological:     Mental Status: He is alert and oriented to person, place, and time.  Psychiatric:        Mood and Affect: Mood normal.         No data to display          Lipid Panel  No results found for: "CHOL", "TRIG", "HDL", "CHOLHDL", "VLDL", "LDLCALC", "LDLDIRECT"  CBC No results found for: "WBC", "RBC", "HGB", "HCT", "PLT", "MCV", "MCH", "MCHC", "RDW", "LYMPHSABS", "MONOABS", "EOSABS", "BASOSABS"  No results found for: "HGBA1C"   No results found for: "TSH"  Assessment & Plan:  1. 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 It is unclear as to his expectation for the visit as he is refusing all my recommendations  2. Underweight We will need to work this up by means of thyroid panel and also check his renal function He again declines having labs   Health Care Maintenance: Declines No orders of the defined types were placed in this encounter.   Follow-up: Return if symptoms worsen or fail to improve.       Hoy Register, MD, FAAFP. [facility] [place], [place] [phone]   06/18/2022, 2:51 PM

## 2022-06-18 NOTE — Patient Instructions (Signed)
Malnutrition, Adult Malnutrition, specifically undernutrition, is a condition that can occur gradually or quickly, it usually depends on the cause. Malnutrition is a group of symptoms that affects adults such as: loss of appetite, weakness, fatigue, and weight loss. What are the causes? This condition may be caused by: A chronic disease, such as dementia, diabetes, cancer, or chronic obstructive pulmonary disease. A sudden illness like sepsis, a major surgery, or a traumatic event that puts you in a hospital in critical care. A mental health disorder, such as depression. A disability that limits you from getting or making food. Medicines. Having tooth or mouth problems. Mistreatment or neglect. In some cases, the cause may not be known. What are the signs or symptoms? Symptoms of this condition include: Loss of more than 5% of your body weight. Being more tired than normal after an activity. Loss of appetite. Not getting out of bed. Not wanting to do usual activities. Frequent infections. Fragile skin that results in skin breakdown or bedsores. How is this diagnosed? This condition may be diagnosed with a diet history, physical exam, and tests. Your health care provider will ask questions about your diet, level of physical activity, access to food, health conditions, and typical dietary patterns, such as: Has your diet changed? Do you have any physical limitations or barriers? Are you able to eat, chew, and safely swallow? Tests may also be done. They may include: Blood tests. Urine tests. Imaging tests, such as: X-rays. CT scan. MRI. Tests to check thinking ability (cognitive tests). Activity tests. Your health care provider may want to see if you can do tasks such as bathing and dressing and if you can move around safely. You may be referred to a specialist. How is this treated? Treatment for this condition depends on the cause. It may be treated by: Treating a disease or  disorder that is causing symptoms. Having talk therapy or taking medicine to treat depression. Improving diet, such as by eating more often or taking nutritional supplements. Changing or stopping a medicine. Having physical or occupational therapy. It often takes a team of health care providers to find the right treatment. Follow these instructions at home:  Take over-the-counter and prescription medicines only as told by your health care provider. Eat a healthy, well-balanced diet. Make sure to get enough calories in each meal. Ask your health care provider how many calories you need. Be physically active. Include strength training as part of your exercise routine. A physical therapist can help to set up an exercise program that is right for you. Make sure you have access to food and are able to prepare meals. Have a plan for what to do if you become unable to make decisions for yourself. Contact a health care provider if you: Are not able to eat well. Are not able to move around. Feel very sad or hopeless. Get help right away if: You have thoughts of ending your life. You cannot eat or drink. You do not get out of bed. Staying at home is no longer safe. You have a fever. Get help right away if you feel like you may hurt yourself or others, or have thoughts about taking your own life. Go to your nearest emergency room or: Call 911. Call the National Suicide Prevention Lifeline at 1-800-273-8255 or 988. This is open 24 hours a day. Text the Crisis Text Line at 741741. Summary Symptoms include loss of appetite and weight loss. Take over-the-counter and prescription medicines only as told by your   health care provider. Eat a healthy, well-balanced diet. Make sure to get enough calories in each meal. Ask your health care provider how many calories you need. Be physically active. Include strength training as part of your exercise routine. A physical therapist can help to set up an exercise  program that is right for you. This information is not intended to replace advice given to you by your health care provider. Make sure you discuss any questions you have with your health care provider. Document Revised: 06/15/2021 Document Reviewed: 06/15/2021 Elsevier Patient Education  2023 Elsevier Inc.  

## 2022-06-24 ENCOUNTER — Telehealth: Payer: Self-pay | Admitting: Emergency Medicine

## 2022-06-24 DIAGNOSIS — K029 Dental caries, unspecified: Secondary | ICD-10-CM

## 2022-06-24 NOTE — Telephone Encounter (Signed)
Patient requesting referral

## 2022-06-24 NOTE — Telephone Encounter (Signed)
Copied from CRM [id number]. Topic: General - Inquiry >> Jun 24, 2022  8:56 AM Darius Kennedy wrote: Reason for CRM: Pt's wife called saying her husband needs an oral Careers adviser.   He can't eat because his mouth is so decayed.  CB@  [phone]  She ask to leave a message if she does not pick up.  She is at work.

## 2022-06-24 NOTE — Telephone Encounter (Signed)
Referral has been placed. 

## 2022-06-24 NOTE — Addendum Note (Signed)
Addended byHoy Register on: 06/24/2022 01:57 PM   Modules accepted: Orders

## 2022-06-25 NOTE — Telephone Encounter (Signed)
Called and left voice mail

## 2022-10-16 NOTE — H&P (Signed)
  Patient: Darius Kennedy  PID: [id number]  DOB: 07-15-1986  SEX: Male   Patient referred by  [facility]  for extraction teeth  CC: Bad teeth  Past Medical History:  Smoker, Drug Abuse/Alcohol Abuse, Mental Health problems    Medications: None    Allergies:     NKDA    Surgeries:   Oral Surgery     Social History       Smoking:  2 ppd          Alcohol:  Drug use: yes                            Exam: BMI  16. Underweight. Gross decay all remaining teeth # 1 - 32.  No purulence, edema, fluctuance, trismus. Oral cancer screening negative. Pharynx clear. No lymphadenopathy.  Panorex: Gross decay all remaining teeth # 1 - 32. Possible cyst left maxilla.   Assessment: ASA 3. Non-restorable  teeth # 1 - 32. Possible cyst left maxilla.             Plan: Extraction Teeth # teeth # 1 - 32. Alveoloplasty. Remove cyst left maxilla.  Hospital Day surgery.                 Rx: n               Risks and complications explained. Questions answered.   Gae Bon, DMD

## 2022-10-30 ENCOUNTER — Other Ambulatory Visit: Payer: Self-pay

## 2022-10-30 ENCOUNTER — Encounter (HOSPITAL_COMMUNITY): Payer: Self-pay | Admitting: Oral Surgery

## 2022-10-30 NOTE — Progress Notes (Signed)
This Probation officer called the patient with questions and instructions for the surgery day. Patient wasn't available and his mother was receiving instructions. Mother states that the patient doesn't have any shortness of breath, fever, cough and chest pain. No signs/symptoms of COVID and no contact with COVID positive people.   PCP - Rocky Crafts, MD Cardiologist - denies  PPM/ICD - denies  CPAP - n/a  Fasting Blood Sugar - n/a  Blood Thinner Instructions: n/a Aspirin Instructions: Patient was instructed: As of today, STOP taking any Aspirin (unless otherwise instructed by your surgeon) Aleve, Naproxen, Ibuprofen, Motrin, Advil, Goody's, BC's, all herbal medications, fish oil, and all vitamins.  ERAS Protcol - n/a  COVID TEST- n/a  Anesthesia review: no   -------------  SDW INSTRUCTIONS given:  Your procedure is scheduled on Friday, January 19th, 2024.  Report to [facility] Main Entrance "A" at 08:15 A.M., and check in at the Admitting office.  Call this number if you have problems the morning of surgery:  [phone]   Remember:  Do not eat or drink after midnight the night before your surgery    Take these medicines the morning of surgery with A SIP OF WATER: Tylenol - PRN   The day of surgery:              Do not wear jewelry,             Do not wear lotions, powders, colognes, or deodorant.            Men may shave face and neck.            Do not bring valuables to the hospital.            [facility] is not responsible for any belongings or valuables.  Do NOT Smoke (Tobacco/Vaping) 24 hours prior to your procedure If you use a CPAP at night, you may bring all equipment for your overnight stay.   Contacts, glasses, dentures or bridgework may not be worn into surgery.      For patients admitted to the hospital, discharge time will be determined by your treatment team.   Patients discharged the day of surgery will not be allowed to drive home, and someone needs to  stay with them for 24 hours.    Special instructions:   [facility]- Preparing For Surgery  Before surgery, you can play an important role. Because skin is not sterile, your skin needs to be as free of germs as possible. You can reduce the number of germs on your skin by washing with CHG (chlorahexidine gluconate) Soap before surgery.  CHG is an antiseptic cleaner which kills germs and bonds with the skin to continue killing germs even after washing.    Oral Hygiene is also important to reduce your risk of infection.  Remember - BRUSH YOUR TEETH THE MORNING OF SURGERY WITH YOUR REGULAR TOOTHPASTE  Please do not use if you have an allergy to CHG or antibacterial soaps. If your skin becomes reddened/irritated stop using the CHG.  Do not shave (including legs and underarms) for at least 48 hours prior to first CHG shower. It is OK to shave your face.  Please follow these instructions carefully.   Shower the NIGHT BEFORE SURGERY and the MORNING OF SURGERY with DIAL Soap.   Pat yourself dry with a CLEAN TOWEL.  Wear CLEAN PAJAMAS to bed the night before surgery  Place CLEAN SHEETS on your bed the night of your first shower and  DO NOT SLEEP WITH PETS.   Day of Surgery: Please shower morning of surgery  Wear Clean/Comfortable clothing the morning of surgery Do not apply any deodorants/lotions.   Remember to brush your teeth WITH YOUR REGULAR TOOTHPASTE.   Questions were answered. Patient verbalized understanding of instructions.

## 2022-11-01 ENCOUNTER — Ambulatory Visit (HOSPITAL_COMMUNITY): Payer: Medicaid Other | Admitting: Certified Registered Nurse Anesthetist

## 2022-11-01 ENCOUNTER — Ambulatory Visit (HOSPITAL_COMMUNITY)
Admission: RE | Admit: 2022-11-01 | Discharge: 2022-11-01 | Disposition: A | Discharge status: Home / Self Care | Payer: Medicaid Other | Attending: Oral Surgery | Admitting: Oral Surgery

## 2022-11-01 ENCOUNTER — Other Ambulatory Visit: Payer: Self-pay

## 2022-11-01 ENCOUNTER — Encounter (HOSPITAL_COMMUNITY): Payer: Self-pay | Admitting: Oral Surgery

## 2022-11-01 ENCOUNTER — Encounter (HOSPITAL_COMMUNITY)
Admission: RE | Disposition: A | Discharge status: Home / Self Care | Payer: Self-pay | Source: Home / Self Care | Attending: Oral Surgery

## 2022-11-01 DIAGNOSIS — K029 Dental caries, unspecified: Secondary | ICD-10-CM | POA: Diagnosis present

## 2022-11-01 DIAGNOSIS — Z5329 Procedure and treatment not carried out because of patient's decision for other reasons: Secondary | ICD-10-CM | POA: Diagnosis not present

## 2022-11-01 HISTORY — DX: Attention-deficit hyperactivity disorder, unspecified type: F90.9

## 2022-11-01 SURGERY — DENTAL RESTORATION/EXTRACTIONS
Anesthesia: General | Site: Mouth

## 2022-11-01 MED ORDER — CEFAZOLIN SODIUM-DEXTROSE 2-4 GM/100ML-% IV SOLN
2.0000 g | INTRAVENOUS | Status: DC
  Filled 2022-11-01: qty 100

## 2022-11-01 MED ORDER — ORAL CARE MOUTH RINSE: 15.0000 mL | Freq: Once | OROMUCOSAL | Status: DC

## 2022-11-01 MED ORDER — CHLORHEXIDINE GLUCONATE 0.12 % MT SOLN
OROMUCOSAL | Status: AC
  Filled 2022-11-01: qty 15

## 2022-11-01 MED ORDER — LACTATED RINGERS IV SOLN: INTRAVENOUS | Status: DC

## 2022-11-01 MED ORDER — CHLORHEXIDINE GLUCONATE 0.12 % MT SOLN: 15.0000 mL | Freq: Once | OROMUCOSAL | Status: DC

## 2022-11-01 NOTE — Progress Notes (Signed)
Procedure cancelled due to patient refusal of IV. Pt left before staff could walk him out.

## 2022-11-01 NOTE — Anesthesia Preprocedure Evaluation (Deleted)
Anesthesia Evaluation  Patient identified by MRN, date of birth, ID band Patient awake    Reviewed: Allergy & Precautions, NPO status , Patient's Chart, lab work & pertinent test results  Airway Mallampati: II  TM Distance: >3 FB Neck ROM: Full    Dental  (+) Dental Advisory Given, Poor Dentition   Pulmonary Current Smoker and Patient abstained from smoking.   Pulmonary exam normal breath sounds clear to auscultation       Cardiovascular negative cardio ROS Normal cardiovascular exam Rhythm:Regular Rate:Normal     Neuro/Psych  PSYCHIATRIC DISORDERS      ADHDnegative neurological ROS     GI/Hepatic ,,,(+)     substance abuse  marijuana use and methamphetamine useNon restorable teeth   Endo/Other  negative endocrine ROS    Renal/GU negative Renal ROS  negative genitourinary   Musculoskeletal negative musculoskeletal ROS (+)    Abdominal   Peds  Hematology negative hematology ROS (+)   Anesthesia Other Findings   Reproductive/Obstetrics                             Anesthesia Physical Anesthesia Plan  ASA: 2  Anesthesia Plan: General   Post-op Pain Management:    Induction: Intravenous  PONV Risk Score and Plan: 1 and Treatment may vary due to age or medical condition, Midazolam, Dexamethasone and Ondansetron  Airway Management Planned: Nasal ETT  Additional Equipment: None  Intra-op Plan:   Post-operative Plan: Extubation in OR  Informed Consent: I have reviewed the patients History and Physical, chart, labs and discussed the procedure including the risks, benefits and alternatives for the proposed anesthesia with the patient or authorized representative who has indicated his/her understanding and acceptance.     Dental advisory given  Plan Discussed with: Anesthesiologist and CRNA  Anesthesia Plan Comments: (Patient refusing IV. Seems under the influence. Will cancel  surgery.)       Anesthesia Quick Evaluation

## 2023-05-04 ENCOUNTER — Encounter (HOSPITAL_COMMUNITY)
Admission: EM | Disposition: A | Discharge status: Home / Self Care | Payer: Self-pay | Source: Home / Self Care | Attending: Internal Medicine

## 2023-05-04 ENCOUNTER — Encounter (HOSPITAL_BASED_OUTPATIENT_CLINIC_OR_DEPARTMENT_OTHER): Payer: Self-pay

## 2023-05-04 ENCOUNTER — Inpatient Hospital Stay (HOSPITAL_BASED_OUTPATIENT_CLINIC_OR_DEPARTMENT_OTHER)
Admission: EM | Admit: 2023-05-04 | Discharge: 2023-05-06 | DRG: 871 | Disposition: A | Discharge status: Home / Self Care | Payer: Medicaid Other | Attending: Internal Medicine | Admitting: Internal Medicine

## 2023-05-04 ENCOUNTER — Emergency Department (HOSPITAL_BASED_OUTPATIENT_CLINIC_OR_DEPARTMENT_OTHER): Payer: Medicaid Other

## 2023-05-04 DIAGNOSIS — R809 Proteinuria, unspecified: Secondary | ICD-10-CM | POA: Diagnosis present

## 2023-05-04 DIAGNOSIS — A419 Sepsis, unspecified organism: Principal | ICD-10-CM | POA: Diagnosis present

## 2023-05-04 DIAGNOSIS — Z681 Body mass index (BMI) 19 or less, adult: Secondary | ICD-10-CM | POA: Diagnosis not present

## 2023-05-04 DIAGNOSIS — R7303 Prediabetes: Secondary | ICD-10-CM | POA: Diagnosis present

## 2023-05-04 DIAGNOSIS — R609 Edema, unspecified: Secondary | ICD-10-CM | POA: Diagnosis present

## 2023-05-04 DIAGNOSIS — I300 Acute nonspecific idiopathic pericarditis: Secondary | ICD-10-CM | POA: Diagnosis not present

## 2023-05-04 DIAGNOSIS — R079 Chest pain, unspecified: Secondary | ICD-10-CM | POA: Diagnosis not present

## 2023-05-04 DIAGNOSIS — E43 Unspecified severe protein-calorie malnutrition: Secondary | ICD-10-CM | POA: Insufficient documentation

## 2023-05-04 DIAGNOSIS — K056 Periodontal disease, unspecified: Secondary | ICD-10-CM | POA: Diagnosis present

## 2023-05-04 DIAGNOSIS — Z79899 Other long term (current) drug therapy: Secondary | ICD-10-CM | POA: Diagnosis not present

## 2023-05-04 DIAGNOSIS — F17210 Nicotine dependence, cigarettes, uncomplicated: Secondary | ICD-10-CM | POA: Diagnosis not present

## 2023-05-04 DIAGNOSIS — F419 Anxiety disorder, unspecified: Secondary | ICD-10-CM | POA: Diagnosis present

## 2023-05-04 DIAGNOSIS — X58XXXA Exposure to other specified factors, initial encounter: Secondary | ICD-10-CM | POA: Diagnosis present

## 2023-05-04 DIAGNOSIS — F40232 Fear of other medical care: Secondary | ICD-10-CM | POA: Diagnosis present

## 2023-05-04 DIAGNOSIS — S025XXA Fracture of tooth (traumatic), initial encounter for closed fracture: Secondary | ICD-10-CM | POA: Diagnosis present

## 2023-05-04 DIAGNOSIS — K0510 Chronic gingivitis, plaque induced: Secondary | ICD-10-CM | POA: Diagnosis present

## 2023-05-04 DIAGNOSIS — D75839 Thrombocytosis, unspecified: Secondary | ICD-10-CM | POA: Diagnosis present

## 2023-05-04 DIAGNOSIS — K029 Dental caries, unspecified: Secondary | ICD-10-CM | POA: Diagnosis present

## 2023-05-04 DIAGNOSIS — Z818 Family history of other mental and behavioral disorders: Secondary | ICD-10-CM | POA: Diagnosis not present

## 2023-05-04 DIAGNOSIS — K529 Noninfective gastroenteritis and colitis, unspecified: Secondary | ICD-10-CM | POA: Diagnosis present

## 2023-05-04 DIAGNOSIS — F23 Brief psychotic disorder: Secondary | ICD-10-CM | POA: Diagnosis present

## 2023-05-04 DIAGNOSIS — I309 Acute pericarditis, unspecified: Secondary | ICD-10-CM

## 2023-05-04 DIAGNOSIS — F1210 Cannabis abuse, uncomplicated: Secondary | ICD-10-CM | POA: Diagnosis present

## 2023-05-04 DIAGNOSIS — E872 Acidosis, unspecified: Secondary | ICD-10-CM | POA: Diagnosis present

## 2023-05-04 DIAGNOSIS — R234 Changes in skin texture: Secondary | ICD-10-CM | POA: Diagnosis present

## 2023-05-04 DIAGNOSIS — K047 Periapical abscess without sinus: Secondary | ICD-10-CM | POA: Diagnosis present

## 2023-05-04 DIAGNOSIS — Z833 Family history of diabetes mellitus: Secondary | ICD-10-CM

## 2023-05-04 DIAGNOSIS — F40231 Fear of injections and transfusions: Secondary | ICD-10-CM | POA: Insufficient documentation

## 2023-05-04 DIAGNOSIS — F909 Attention-deficit hyperactivity disorder, unspecified type: Secondary | ICD-10-CM | POA: Diagnosis present

## 2023-05-04 LAB — CBC WITH DIFFERENTIAL/PLATELET
Abs Immature Granulocytes: 0.29 10*3/uL — ABNORMAL HIGH (ref 0.00–0.07)
Basophils Absolute: 0.1 10*3/uL (ref 0.0–0.1)
Basophils Relative: 1 %
Eosinophils Absolute: 0 10*3/uL (ref 0.0–0.5)
Eosinophils Relative: 0 %
HCT: 48.1 % (ref 39.0–52.0)
Hemoglobin: 16.5 g/dL (ref 13.0–17.0)
Immature Granulocytes: 1 %
Lymphocytes Relative: 9 %
Lymphs Abs: 2.5 10*3/uL (ref 0.7–4.0)
MCH: 29.6 pg (ref 26.0–34.0)
MCHC: 34.3 g/dL (ref 30.0–36.0)
MCV: 86.2 fL (ref 80.0–100.0)
Monocytes Absolute: 2 10*3/uL — ABNORMAL HIGH (ref 0.1–1.0)
Monocytes Relative: 7 %
Neutro Abs: 22.9 10*3/uL — ABNORMAL HIGH (ref 1.7–7.7)
Neutrophils Relative %: 82 %
Platelets: 405 10*3/uL — ABNORMAL HIGH (ref 150–400)
RBC: 5.58 MIL/uL (ref 4.22–5.81)
RDW: 13.5 % (ref 11.5–15.5)
WBC: 27.9 10*3/uL — ABNORMAL HIGH (ref 4.0–10.5)
nRBC: 0 % (ref 0.0–0.2)

## 2023-05-04 LAB — TROPONIN I (HIGH SENSITIVITY)
Troponin I (High Sensitivity): 2 ng/L (ref <18)
Troponin I (High Sensitivity): 4 ng/L (ref <18)

## 2023-05-04 LAB — COMPREHENSIVE METABOLIC PANEL
ALT: 54 U/L — ABNORMAL HIGH (ref 0–44)
AST: 27 U/L (ref 15–41)
Albumin: 5.4 g/dL — ABNORMAL HIGH (ref 3.5–5.0)
Alkaline Phosphatase: 127 U/L — ABNORMAL HIGH (ref 38–126)
Anion gap: 18 — ABNORMAL HIGH (ref 5–15)
BUN: 8 mg/dL (ref 6–20)
CO2: 19 mmol/L — ABNORMAL LOW (ref 22–32)
Calcium: 10.6 mg/dL — ABNORMAL HIGH (ref 8.9–10.3)
Chloride: 100 mmol/L (ref 98–111)
Creatinine, Ser: 0.86 mg/dL (ref 0.61–1.24)
GFR, Estimated: >60 mL/min (ref >60)
Glucose, Bld: 168 mg/dL — ABNORMAL HIGH (ref 70–99)
Potassium: 3.8 mmol/L (ref 3.5–5.1)
Sodium: 137 mmol/L (ref 135–145)
Total Bilirubin: 0.8 mg/dL (ref 0.3–1.2)
Total Protein: 8.6 g/dL — ABNORMAL HIGH (ref 6.5–8.1)

## 2023-05-04 LAB — PROTIME-INR
INR: 1 (ref 0.8–1.2)
Prothrombin Time: 13.8 seconds (ref 11.4–15.2)

## 2023-05-04 LAB — APTT: aPTT: 23 seconds — ABNORMAL LOW (ref 24–36)

## 2023-05-04 SURGERY — CORONARY/GRAFT ACUTE MI REVASCULARIZATION
Anesthesia: LOCAL

## 2023-05-04 MED ORDER — RIVAROXABAN 10 MG PO TABS
10.0000 mg | ORAL_TABLET | Freq: Every day | ORAL | Status: DC
  Administered 2023-05-05 – 2023-05-06 (×2): 10 mg via ORAL
  Filled 2023-05-04 (×2): qty 1

## 2023-05-04 MED ORDER — KETAMINE HCL 50 MG/ML IJ SOLN
4.0000 mg/kg | Freq: Once | INTRAMUSCULAR | Status: DC
Start: 2023-05-04 — End: 2023-05-04

## 2023-05-04 MED ORDER — IBUPROFEN 600 MG PO TABS
600.0000 mg | ORAL_TABLET | Freq: Three times a day (TID) | ORAL | Status: DC
  Administered 2023-05-04 – 2023-05-06 (×4): 600 mg via ORAL
  Filled 2023-05-04 (×6): qty 1

## 2023-05-04 MED ORDER — ASPIRIN 81 MG PO CHEW
324.0000 mg | CHEWABLE_TABLET | Freq: Once | ORAL | Status: AC
  Administered 2023-05-04: 324 mg via ORAL
  Filled 2023-05-04: qty 4

## 2023-05-04 MED ORDER — SODIUM CHLORIDE 0.9 % IV SOLN
3.0000 g | Freq: Once | INTRAVENOUS | Status: AC
  Administered 2023-05-04: 3 g via INTRAVENOUS
  Filled 2023-05-04: qty 8

## 2023-05-04 MED ORDER — ONDANSETRON 4 MG PO TBDP
4.0000 mg | ORAL_TABLET | Freq: Once | ORAL | Status: AC
  Administered 2023-05-04: 4 mg via ORAL
  Filled 2023-05-04: qty 1

## 2023-05-04 MED ORDER — HALOPERIDOL LACTATE 5 MG/ML IJ SOLN
5.0000 mg | Freq: Once | INTRAMUSCULAR | Status: AC
  Administered 2023-05-04: 5 mg via INTRAMUSCULAR
  Filled 2023-05-04: qty 1

## 2023-05-04 MED ORDER — COLCHICINE 0.6 MG PO TABS
0.6000 mg | ORAL_TABLET | Freq: Every day | ORAL | Status: DC
  Administered 2023-05-05 – 2023-05-06 (×2): 0.6 mg via ORAL
  Filled 2023-05-04 (×2): qty 1

## 2023-05-04 MED ORDER — LORAZEPAM 2 MG/ML IJ SOLN
2.0000 mg | Freq: Once | INTRAMUSCULAR | Status: AC
  Administered 2023-05-04: 2 mg via INTRAVENOUS
  Filled 2023-05-04: qty 1

## 2023-05-04 MED ORDER — NITROGLYCERIN 0.4 MG SL SUBL
0.4000 mg | SUBLINGUAL_TABLET | SUBLINGUAL | Status: DC | PRN
  Administered 2023-05-04: 0.4 mg via SUBLINGUAL
  Filled 2023-05-04: qty 1

## 2023-05-04 NOTE — ED Notes (Signed)
Repeat Trop sent

## 2023-05-04 NOTE — Significant Event (Signed)
[facility] Interventional Cardiology Note  Date: 05/04/23 Time: 9:06 PM  I was initially contacted by Dr. Wilkie Aye at [facility] regarding possible STEMI for Darius Kennedy.  He presented with chest pain, nausea, and diaphoresis that began this morning and was found to be agitated.  She felt that he was acutely psychotic and administered Haldol and midazolam for sedation.  He was then involuntarily committed.  Initial EKG was concerning for anterolateral STEMI.  He was therefore transferred to [facility] for further evaluation.  On arrival here, he was somnolent but arousable and complained of 8/10 chest pain.  However, he did not appear to be in any distress.  Repeat EKG here showed normal sinus rhythm with ST elevation in leads I, II, and V2 through V6.  Bedside echocardiogram performed by Dr. Hulan Saas and myself showed normal biventricular systolic function with normal LV wall motion.  Given the patient's young age, benign appearing echocardiogram, and initial high-sensitivity troponin I of 2 with pain having started this morning, EKG changes are most likely due to early repolarization or less likely acute pericarditis.  Acute coronary syndrome is felt unlikely.  The patient states that he does not wish to undergo cardiac catheterization as he is afraid of being stuck with a needle.  I have spoken with him and both of his parents, and we have agreed to defer proceeding with emergent cardiac catheterization.  Further workup of chest pain, leukocytosis, and agitation leading to involuntary commitment per the ED team.  Formal cardiology consultation to follow by Dr. Hulan Saas.  Yvonne Kendall, MD [facility]

## 2023-05-04 NOTE — ED Notes (Signed)
EDP at the bedside conversing and assessing patient.

## 2023-05-04 NOTE — Hospital Course (Addendum)
Sepsis complicated by Acute Pericarditis Pt initially presented with CP and N/V, had EKG with ST elevations in multiple leads and PR depressions with leukocytosis with neutrophil predominance. ACS ruled out given negative troponins x2. Cards consulted and after conversation with pt + family, opted not to pursue urgent cath. Suspect chest pain is due to acute pericarditis, which was suspected to be an inflammatory complication of sepsis stemming from severe periodontal disease with possible abscesses seen on CT done 7/22. Blood cultures unfortunatly not collected when rest of blood work was, and pt refused subsequent collection due to needle phobia. Echocardiogram done 7/22 showed LVEF 60-65%, no evidence of pericardial effusion. Pt on colchicine 0.6 mg daily for 3 months and ibuprofen 600 mg TID for 2 weeks as per cardiology recommendations. Pt started on empiric Unasyn, switched to oral Augmentin on 7/23 for pt to take a dose that evening, then complete a 12 day course. Pt will establish with PCP outpatient to help coordinate care; appointment arranged prior to discharge.  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 Previously was going to undergo dental surgery in 10/2022, but pt refused IV and left procedure; this oral surgeon then fired pt from practice. CT Maxillofacial done 7/22 showed very poor dentition with numerous carious and fractured teeth; periapical lucency surrounding numerous teeth (some which is expansile appearance), compatible periodontal disease and possible dental abscesses. Possible abscesses are a likely nidus for pericarditis. Pt strongly encouraged to follow-up with a different oral surgeon at discharge; PCP can help with this if pt and family unable to find a practice.     Prediabetes Poor nutrition UA collected on admission abnormal with protein 30 and glucose >500; also initially with mild anion gap metabolic acidosis, since  resolved. No hx of diabetes. Ha1c 5.7% with B-hydroxybutyric acid of 0.06 on 7/22 meaning very low likelihood that initial vomiting related to DKA, no concern at this time. At baseline, pt has poor po intake and frequently skips meals; unsure if related to poor dentition. Ensure BID supplementation. Nutrition consulted, who recommended pt supplement with Ensure TID to ensure adequate caloric intake.   Anxiety Severe Needle Phobia Initially concerns for paranoia/psychosis on patient presentation and was made IVC given AMS/Psychosis. He also had increased levels of anxiety in a hospital setting, especially regarding needles; this might have been driving initial presentation. Pt improved after receiving clonazepam, Seroquel at bedtime. Will ensure this phobia is in pt's chart so that providers are aware of this moving forward. Pt examined by psych on 7/23, who agreed with plans to discharge pt and have him follow-up outpatient; pt provided list of psychiatric resources to follow-up with outpatient at discharge.  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 Denies current/recent use. Does report recent use of marijuana, consistent with UDS which was positive for THC only. Pt also reports longstanding history of tobacco use with smoking cigarettes, given nicotine patch while in hospital.

## 2023-05-04 NOTE — ED Notes (Signed)
Family updated as to patient's status at bedside 
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
## 2023-05-04 NOTE — ED Notes (Signed)
Pt was placed on 2 lpm [place].
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
## 2023-05-04 NOTE — ED Notes (Addendum)
Pt came into the ED room 3 vomiting, diaphoretic, chest pain, and left arm pain. Pt's mother stated pain started around lunch time today.  Pt was agreeable to all treatment until it came to getting an IV and blood work. Care was delayed to same. Pt left AMA. Provider was able to get in touch with the family and have Pt come back for treatment. Pt was given medications to help sedate Pt so he would be able to get IV assess. IV was est and blood work was drawn and taken to the lab. Pt was able to relax and was agreeable to further treatment plan.

## 2023-05-04 NOTE — ED Notes (Signed)
IVC'd 05/04/23, exp 05/11/23; Original in [employer], and 3 sets of IVC docs on clipboard in [facility].
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
## 2023-05-04 NOTE — ED Notes (Signed)
Pt transfer from [facility] for cardiac cath

## 2023-05-04 NOTE — ED Notes (Addendum)
Bedside EKG & US done by cardiology team

## 2023-05-04 NOTE — ED Provider Notes (Signed)
  Physical Exam  BP 104/76   Pulse 90   Temp 98.1 F (36.7 C)   Resp (!) 21   Ht 5\' 4"  (1.626 m)   Wt 45.4 kg   SpO2 95%   BMI 17.16 kg/m   Physical Exam Constitutional:      General: He is not in acute distress.    Appearance: Normal appearance.  HENT:     Head: Normocephalic and atraumatic.     Nose: No congestion or rhinorrhea.  Eyes:     General:        Right eye: No discharge.        Left eye: No discharge.     Extraocular Movements: Extraocular movements intact.     Pupils: Pupils are equal, round, and reactive to light.  Cardiovascular:     Rate and Rhythm: Normal rate and regular rhythm.     Heart sounds: No murmur heard. Pulmonary:     Effort: No respiratory distress.     Breath sounds: No wheezing or rales.  Abdominal:     General: There is no distension.     Tenderness: There is no abdominal tenderness.  Musculoskeletal:        General: Normal range of motion.     Cervical back: Normal range of motion.  Skin:    General: Skin is warm and dry.  Neurological:     General: No focal deficit present.     Mental Status: He is alert.     Procedures  Procedures  ED Course / MDM    Medical Decision Making Amount and/or Complexity of Data Reviewed Labs: ordered. Radiology: ordered.  Risk OTC drugs. Prescription drug management. Decision regarding hospitalization.   Patient received in transfer.  Initial EKG with concern for STEMI.  IVC'd after attempting to leave the emergency department AGAINST MEDICAL ADVICE with a life-threatening condition where he received Haldol and Ativan for agitation.  Here in the Emergency Department, patient was evaluated at bedside by cardiology who performed bedside echo and did not see wall motion abnormalities.  Repeat EKGs look more like benign early repull and troponins are normal.  Cardiology recommending medical admission and reevaluation for possible catheterization tomorrow.  Patient placed on Unasyn for elevated  white count and dental abscesses.  Patient admitted to medicine.       Glendora Score, MD 05/04/23 2231

## 2023-05-04 NOTE — Progress Notes (Signed)
   05/04/23 2024  Spiritual Encounters  Type of Visit Initial  Care provided to: Family  Referral source Code page  Reason for visit Code (STEMI)  OnCall Visit Yes  Spiritual Framework  Presenting Themes Values and beliefs  Values/beliefs belief in prayer  Community/Connection Family  Patient Stress Factors Health changes  Family Stress Factors Not reviewed  Interventions  Spiritual Care Interventions Made Established relationship of care and support;Compassionate presence;Prayer  Intervention Outcomes  Outcomes Connection to spiritual care;Awareness of support;Reduced anxiety;Reduced fear;Reduced isolation  Spiritual Care Plan  Spiritual Care Issues Still Outstanding No further spiritual care needs at this time (see row info)   Chaplain responded to code STEMI. Patient was sedated but chaplain met with the patient's parents and provided prayer per their request. Code STEMI was canceled after chaplain provided care. No further spiritual care needs at this time.   Arlyce Dice, Chaplain Resident [phone]

## 2023-05-04 NOTE — ED Triage Notes (Signed)
Patient here POV from Home.  Endorses CP that began earlier this AM. Some SOB as well. Some N/V.  Mid chest and radiates left as well.   NAD Noted during Triage. A&Ox4. GCS 15. BIB Wheelchair.

## 2023-05-04 NOTE — ED Notes (Signed)
Patient refusing Blood Specimen and IV Insertion.

## 2023-05-05 ENCOUNTER — Other Ambulatory Visit: Payer: Self-pay

## 2023-05-05 ENCOUNTER — Inpatient Hospital Stay (HOSPITAL_COMMUNITY): Payer: Medicaid Other

## 2023-05-05 DIAGNOSIS — R079 Chest pain, unspecified: Secondary | ICD-10-CM | POA: Diagnosis not present

## 2023-05-05 DIAGNOSIS — I300 Acute nonspecific idiopathic pericarditis: Secondary | ICD-10-CM | POA: Diagnosis not present

## 2023-05-05 DIAGNOSIS — K056 Periodontal disease, unspecified: Secondary | ICD-10-CM | POA: Insufficient documentation

## 2023-05-05 LAB — URINALYSIS, COMPLETE (UACMP) WITH MICROSCOPIC
Bacteria, UA: NONE SEEN
Bilirubin Urine: NEGATIVE
Glucose, UA: >=500 mg/dL — AB
Hgb urine dipstick: NEGATIVE
Ketones, ur: NEGATIVE mg/dL
Leukocytes,Ua: NEGATIVE
Nitrite: NEGATIVE
Protein, ur: 30 mg/dL — AB
Specific Gravity, Urine: 1.033 — ABNORMAL HIGH (ref 1.005–1.030)
pH: 5 (ref 5.0–8.0)

## 2023-05-05 LAB — CBC
HCT: 43.3 % (ref 39.0–52.0)
Hemoglobin: 14.9 g/dL (ref 13.0–17.0)
MCH: 29.1 pg (ref 26.0–34.0)
MCHC: 34.4 g/dL (ref 30.0–36.0)
MCV: 84.6 fL (ref 80.0–100.0)
Platelets: 346 10*3/uL (ref 150–400)
RBC: 5.12 MIL/uL (ref 4.22–5.81)
RDW: 13.5 % (ref 11.5–15.5)
WBC: 18.1 10*3/uL — ABNORMAL HIGH (ref 4.0–10.5)
nRBC: 0 % (ref 0.0–0.2)

## 2023-05-05 LAB — COMPREHENSIVE METABOLIC PANEL
ALT: 44 U/L (ref 0–44)
AST: 26 U/L (ref 15–41)
Albumin: 4.1 g/dL (ref 3.5–5.0)
Alkaline Phosphatase: 106 U/L (ref 38–126)
Anion gap: 15 (ref 5–15)
BUN: 12 mg/dL (ref 6–20)
CO2: 22 mmol/L (ref 22–32)
Calcium: 9.8 mg/dL (ref 8.9–10.3)
Chloride: 98 mmol/L (ref 98–111)
Creatinine, Ser: 0.94 mg/dL (ref 0.61–1.24)
GFR, Estimated: >60 mL/min (ref >60)
Glucose, Bld: 139 mg/dL — ABNORMAL HIGH (ref 70–99)
Potassium: 4.2 mmol/L (ref 3.5–5.1)
Sodium: 135 mmol/L (ref 135–145)
Total Bilirubin: 0.7 mg/dL (ref 0.3–1.2)
Total Protein: 7.1 g/dL (ref 6.5–8.1)

## 2023-05-05 LAB — ECHOCARDIOGRAM COMPLETE
Area-P 1/2: 2.99 cm2
Height: 64 in
S' Lateral: 2.3 cm
Weight: 1600 oz

## 2023-05-05 LAB — HEMOGLOBIN A1C
Hgb A1c MFr Bld: 5.7 % — ABNORMAL HIGH (ref 4.8–5.6)
Mean Plasma Glucose: 116.89 mg/dL

## 2023-05-05 LAB — LIPID PANEL
Cholesterol: 138 mg/dL (ref 0–200)
HDL: 39 mg/dL — ABNORMAL LOW (ref >40)
LDL Cholesterol: 65 mg/dL (ref 0–99)
Total CHOL/HDL Ratio: 3.5 RATIO
Triglycerides: 170 mg/dL — ABNORMAL HIGH (ref <150)
VLDL: 34 mg/dL (ref 0–40)

## 2023-05-05 LAB — BETA-HYDROXYBUTYRIC ACID: Beta-Hydroxybutyric Acid: 0.06 mmol/L (ref 0.05–0.27)

## 2023-05-05 LAB — RAPID URINE DRUG SCREEN, HOSP PERFORMED
Amphetamines: NOT DETECTED
Barbiturates: NOT DETECTED
Benzodiazepines: NOT DETECTED
Cocaine: NOT DETECTED
Opiates: NOT DETECTED
Tetrahydrocannabinol: POSITIVE — AB

## 2023-05-05 LAB — PHOSPHORUS: Phosphorus: 3.9 mg/dL (ref 2.5–4.6)

## 2023-05-05 LAB — MAGNESIUM: Magnesium: 2 mg/dL (ref 1.7–2.4)

## 2023-05-05 LAB — LACTIC ACID, PLASMA: Lactic Acid, Venous: 1.2 mmol/L (ref 0.5–1.9)

## 2023-05-05 MED ORDER — MUPIROCIN CALCIUM 2 % EX CREA
TOPICAL_CREAM | Freq: Two times a day (BID) | CUTANEOUS | Status: DC
  Filled 2023-05-05: qty 15

## 2023-05-05 MED ORDER — CLONAZEPAM 0.5 MG PO TABS
0.5000 mg | ORAL_TABLET | Freq: Two times a day (BID) | ORAL | Status: AC
  Administered 2023-05-05 (×2): 0.5 mg via ORAL
  Filled 2023-05-05 (×2): qty 1

## 2023-05-05 MED ORDER — ENSURE ENLIVE PO LIQD: 237.0000 mL | Freq: Two times a day (BID) | ORAL | Status: DC

## 2023-05-05 MED ORDER — NICOTINE 7 MG/24HR TD PT24
7.0000 mg | MEDICATED_PATCH | Freq: Every day | TRANSDERMAL | Status: DC
  Administered 2023-05-05: 7 mg via TRANSDERMAL
  Filled 2023-05-05 (×2): qty 1

## 2023-05-05 MED ORDER — NICOTINE 14 MG/24HR TD PT24
14.0000 mg | MEDICATED_PATCH | Freq: Every day | TRANSDERMAL | Status: DC
  Administered 2023-05-06: 14 mg via TRANSDERMAL
  Filled 2023-05-05 (×2): qty 1

## 2023-05-05 MED ORDER — IOHEXOL 350 MG/ML SOLN
75.0000 mL | Freq: Once | INTRAVENOUS | Status: AC | PRN
  Administered 2023-05-05: 75 mL via INTRAVENOUS

## 2023-05-05 MED ORDER — QUETIAPINE FUMARATE 50 MG PO TABS
25.0000 mg | ORAL_TABLET | Freq: Every day | ORAL | Status: DC
  Administered 2023-05-05: 25 mg via ORAL
  Filled 2023-05-05: qty 1

## 2023-05-05 MED ORDER — LACTATED RINGERS IV BOLUS
1000.0000 mL | Freq: Once | INTRAVENOUS | Status: AC
  Administered 2023-05-05: 1000 mL via INTRAVENOUS

## 2023-05-05 MED ORDER — SODIUM CHLORIDE 0.9 % IV SOLN
3.0000 g | Freq: Four times a day (QID) | INTRAVENOUS | Status: DC
  Administered 2023-05-05 – 2023-05-06 (×6): 3 g via INTRAVENOUS
  Filled 2023-05-05 (×6): qty 8

## 2023-05-05 NOTE — ED Notes (Signed)
Patient transported to CT 

## 2023-05-05 NOTE — Progress Notes (Signed)
  Echocardiogram 2D Echocardiogram has been performed.  Leda Roys RDCS 05/05/2023, 10:04 AM

## 2023-05-05 NOTE — Progress Notes (Signed)
Dr. Hessie Diener and medical student Governor Rooks went to see pt at bedside after being informed by nurse that pt was refusing blood draw for culture. Pt reported that he did not want to get the blood draw done as he felt he'd had too much blood drawn already and he did not want to have to pay for more blood work. Also seemed to be a component of his significant fear of needles at work. Dr. Hessie Diener and student explained to pt that the blood culture would help further refine treatment by informing the medical team whether a different or longer course of antibiotics was needed to treat his infection; pt continued to refuse blood draw. Dr. Hessie Diener and student then offered pt a small dose of an anxiolytic prior to the blood draw to see if this would allow him to undergo a blood draw; pt refused. Pt then requested to get up and walk some in the room and, under supervision, removed his monitoring and paced the room for a few minutes. Pt later hooked himself back up to the monitors.  Dr. Daiva Eves and Dr. Antony Contras then joined Dr. Hessie Diener and medical student to discuss pt's diagnosis and treatment course with pt and family. Pt was informed that, although the antibiotics would help manage current infection, it was likely he would have repeat infections without addressing the suspected source of his oral abscesses. Pt's parents reported that pt was fired by prior oral surgeon due to cancelling his surgery last minute. Pt and family encouraged to follow up with a different oral surgeon outpatient.  It was decided that pt would take a lab holiday tomorrow with no additional blood draws done; pt was informed he would need repeat CBC done outpatient to monitor for leukocytosis. It was also decided that pt would start clonazepam 0.5 mg BID for anxiety, to be given shortly after medical team left; this would be in addition to bedtime Seroquel. Pt was amenable to remaining in the hospital overnight with tentative plan to  discharge pt tomorrow afternoon pending consults by nutrition, psychology, and ENT.

## 2023-05-05 NOTE — Progress Notes (Addendum)
Misty Stanley from the lab called this RN about the Blood culture ordered 05/04/23 22:10 which wasn't drawn. Per Chart Review(see on chart), This labs were collected around 22:12. Patient was admitted to [facility] around 1814. Internal Meds covering was paged and they are aware.

## 2023-05-05 NOTE — ED Notes (Signed)
ED TO INPATIENT HANDOFF REPORT  ED Nurse Name and Phone #: [phone]  S Name/Age/Gender Darius Kennedy 37 y.o. male Room/Bed: 040C/040C  Code Status   Code Status: Full Code  Home/SNF/Other Home Patient oriented to: self, place, time, and situation Is this baseline? Yes   Triage Complete: Triage complete  Chief Complaint Chest pain [R07.9]  Triage Note Patient here POV from Home.  Endorses CP that began earlier this AM. Some SOB as well. Some N/V.  Mid chest and radiates left as well.   NAD Noted during Triage. A&Ox4. GCS 15. BIB Wheelchair.    Allergies No Known Allergies  Level of Care/Admitting Diagnosis ED Disposition     ED Disposition  Admit   Condition  --   Comment  Hospital Area: [facility] [[id number]]  Level of Care: Telemetry Medical [104]  May admit patient to [facility] or [facility] if equivalent level of care is available:: No  Covid Evaluation: Asymptomatic - no recent exposure (last 10 days) testing not required  Diagnosis: Chest pain [744799]  Admitting Physician: Reymundo Poll [[id number]]  Attending Physician: Reymundo Poll [[id number]]  Certification:: I certify this patient will need inpatient services for at least 2 midnights  Estimated Length of Stay: 2          B Medical/Surgery History Past Medical History:  Diagnosis Date   ADHD (attention deficit hyperactivity disorder)    Drug abuse (HCC)    Seasonal allergies    Past Surgical History:  Procedure Laterality Date   benign mass  1991   18 mo- removed from flank   TYMPANOSTOMY TUBE PLACEMENT Bilateral      A IV Location/Drains/Wounds Patient Lines/Drains/Airways Status     Active Line/Drains/Airways     Name Placement date Placement time Site Days   Peripheral IV 05/04/23 20 G 1" Anterior;Left;Proximal Forearm 05/04/23  1849  Forearm  1   Peripheral IV 05/04/23 20 G 1" Anterior;Proximal;Right Forearm 05/04/23  2208  Forearm  1             Intake/Output Last 24 hours  Intake/Output Summary (Last 24 hours) at 05/05/2023 1708 Last data filed at 05/05/2023 1457 Gross per 24 hour  Intake 1440 ml  Output --  Net 1440 ml    Labs/Imaging Results for orders placed or performed during the hospital encounter of 05/04/23 (from the past 48 hour(s))  CBC with Differential/Platelet     Status: Abnormal   Collection Time: 05/04/23  6:43 PM  Result Value Ref Range   WBC 27.9 (H) 4.0 - 10.5 K/uL   RBC 5.58 4.22 - 5.81 MIL/uL   Hemoglobin 16.5 13.0 - 17.0 g/dL   HCT 43.3 29.5 - 18.8 %   MCV 86.2 80.0 - 100.0 fL   MCH 29.6 26.0 - 34.0 pg   MCHC 34.3 30.0 - 36.0 g/dL   RDW 41.6 60.6 - 30.1 %   Platelets 405 (H) 150 - 400 K/uL   nRBC 0.0 0.0 - 0.2 %   Neutrophils Relative % 82 %   Neutro Abs 22.9 (H) 1.7 - 7.7 K/uL   Lymphocytes Relative 9 %   Lymphs Abs 2.5 0.7 - 4.0 K/uL   Monocytes Relative 7 %   Monocytes Absolute 2.0 (H) 0.1 - 1.0 K/uL   Eosinophils Relative 0 %   Eosinophils Absolute 0.0 0.0 - 0.5 K/uL   Basophils Relative 1 %   Basophils Absolute 0.1 0.0 - 0.1 K/uL   Immature Granulocytes 1 %  Abs Immature Granulocytes 0.29 (H) 0.00 - 0.07 K/uL    Comment: Performed at [facility], [address] [place], [place] [address]  Protime-INR     Status: None   Collection Time: 05/04/23  6:43 PM  Result Value Ref Range   Prothrombin Time 13.8 11.4 - 15.2 seconds   INR 1.0 0.8 - 1.2    Comment: (NOTE) INR goal varies based on device and disease states. Performed at [facility], [address] [place], [place] [address]   APTT     Status: Abnormal   Collection Time: 05/04/23  6:43 PM  Result Value Ref Range   aPTT 23 (L) 24 - 36 seconds    Comment: Performed at [facility], [address], [place], [place] [address]  Comprehensive metabolic panel     Status: Abnormal   Collection Time: 05/04/23  6:43 PM  Result Value Ref Range   Sodium 137 135 - 145  mmol/L   Potassium 3.8 3.5 - 5.1 mmol/L   Chloride 100 98 - 111 mmol/L   CO2 19 (L) 22 - 32 mmol/L   Glucose, Bld 168 (H) 70 - 99 mg/dL    Comment: Glucose reference range applies only to samples taken after fasting for at least 8 hours.   BUN 8 6 - 20 mg/dL   Creatinine, Ser 1.74 0.61 - 1.24 mg/dL   Calcium 08.1 (H) 8.9 - 10.3 mg/dL   Total Protein 8.6 (H) 6.5 - 8.1 g/dL   Albumin 5.4 (H) 3.5 - 5.0 g/dL   AST 27 15 - 41 U/L   ALT 54 (H) 0 - 44 U/L   Alkaline Phosphatase 127 (H) 38 - 126 U/L   Total Bilirubin 0.8 0.3 - 1.2 mg/dL   GFR, Estimated >44 >81 mL/min    Comment: (NOTE) Calculated using the CKD-EPI Creatinine Equation (2021)    Anion gap 18 (H) 5 - 15    Comment: Performed at [facility], [address], [place], [place] [address]  Troponin I (High Sensitivity)     Status: None   Collection Time: 05/04/23  6:43 PM  Result Value Ref Range   Troponin I (High Sensitivity) 2 <18 ng/L    Comment: (NOTE) Elevated high sensitivity troponin I (hsTnI) values and significant  changes across serial measurements may suggest ACS but many other  chronic and acute conditions are known to elevate hsTnI results.  Refer to the "Links" section for chest pain algorithms and additional  guidance. Performed at [facility], [address] [place], [place] [address]   Lipid panel     Status: Abnormal   Collection Time: 05/04/23  6:43 PM  Result Value Ref Range   Cholesterol 138 0 - 200 mg/dL   Triglycerides 637 (H) <150 mg/dL   HDL 39 (L) >85 mg/dL   Total CHOL/HDL Ratio 3.5 RATIO   VLDL 34 0 - 40 mg/dL   LDL Cholesterol 65 0 - 99 mg/dL    Comment:        Total Cholesterol/HDL:CHD Risk Coronary Heart Disease Risk Table                     Men   Women  1/2 Average Risk   3.4   3.3  Average Risk       5.0   4.4  2 X Average Risk   9.6   7.1  3 X Average Risk  23.4   11.0  Use the calculated Patient Ratio above and the CHD Risk  Table to determine the patient's CHD Risk.        ATP III CLASSIFICATION (LDL):  <100     mg/dL   Optimal  161-096  mg/dL   Near or Above                    Optimal  130-159  mg/dL   Borderline  045-409  mg/dL   High  >811     mg/dL   Very High Performed at [facility], [address] [place], [place] [address]   Troponin I (High Sensitivity)     Status: None   Collection Time: 05/04/23  8:46 PM  Result Value Ref Range   Troponin I (High Sensitivity) 4 <18 ng/L    Comment: (NOTE) Elevated high sensitivity troponin I (hsTnI) values and significant  changes across serial measurements may suggest ACS but many other  chronic and acute conditions are known to elevate hsTnI results.  Refer to the "Links" section for chest pain algorithms and additional  guidance. Performed at [facility], [address], [place], [place] [address]   Urinalysis, Complete w Microscopic -Urine, Clean Catch     Status: Abnormal   Collection Time: 05/05/23 12:30 AM  Result Value Ref Range   Color, Urine AMBER (A) YELLOW    Comment: BIOCHEMICALS MAY BE AFFECTED BY COLOR   APPearance CLEAR CLEAR   Specific Gravity, Urine 1.033 (H) 1.005 - 1.030   pH 5.0 5.0 - 8.0   Glucose, UA >=500 (A) NEGATIVE mg/dL   Hgb urine dipstick NEGATIVE NEGATIVE   Bilirubin Urine NEGATIVE NEGATIVE   Ketones, ur NEGATIVE NEGATIVE mg/dL   Protein, ur 30 (A) NEGATIVE mg/dL   Nitrite NEGATIVE NEGATIVE   Leukocytes,Ua NEGATIVE NEGATIVE   RBC / HPF 0-5 0 - 5 RBC/hpf   WBC, UA 0-5 0 - 5 WBC/hpf   Bacteria, UA NONE SEEN NONE SEEN   Squamous Epithelial / HPF 0-5 0 - 5 /HPF   Mucus PRESENT    Hyaline Casts, UA PRESENT     Comment: Performed at [facility], [address] [place], [place] [address]  Rapid urine drug screen (hospital performed)     Status: Abnormal   Collection Time: 05/05/23 12:30 AM  Result Value Ref Range   Opiates NONE DETECTED NONE DETECTED   Cocaine NONE DETECTED NONE DETECTED    Benzodiazepines NONE DETECTED NONE DETECTED   Amphetamines NONE DETECTED NONE DETECTED   Tetrahydrocannabinol POSITIVE (A) NONE DETECTED   Barbiturates NONE DETECTED NONE DETECTED    Comment: (NOTE) DRUG SCREEN FOR MEDICAL PURPOSES ONLY.  IF CONFIRMATION IS NEEDED FOR ANY PURPOSE, NOTIFY LAB WITHIN 5 DAYS.  LOWEST DETECTABLE LIMITS FOR URINE DRUG SCREEN Drug Class                     Cutoff (ng/mL) Amphetamine and metabolites    1000 Barbiturate and metabolites    200 Benzodiazepine                 200 Opiates and metabolites        300 Cocaine and metabolites        300 THC                            50 Performed at [facility], [address] [place],  [place] [address]   Comprehensive metabolic panel     Status: Abnormal   Collection Time: 05/05/23  3:23 AM  Result Value Ref Range   Sodium 135 135 - 145 mmol/L   Potassium 4.2 3.5 - 5.1 mmol/L   Chloride 98 98 - 111 mmol/L   CO2 22 22 - 32 mmol/L   Glucose, Bld 139 (H) 70 - 99 mg/dL    Comment: Glucose reference range applies only to samples taken after fasting for at least 8 hours.   BUN 12 6 - 20 mg/dL   Creatinine, Ser 1.91 0.61 - 1.24 mg/dL   Calcium 9.8 8.9 - 47.8 mg/dL   Total Protein 7.1 6.5 - 8.1 g/dL   Albumin 4.1 3.5 - 5.0 g/dL   AST 26 15 - 41 U/L   ALT 44 0 - 44 U/L   Alkaline Phosphatase 106 38 - 126 U/L   Total Bilirubin 0.7 0.3 - 1.2 mg/dL   GFR, Estimated >29 >56 mL/min    Comment: (NOTE) Calculated using the CKD-EPI Creatinine Equation (2021)    Anion gap 15 5 - 15    Comment: Performed at [facility], [address], [place], [place] [address]  CBC     Status: Abnormal   Collection Time: 05/05/23  3:23 AM  Result Value Ref Range   WBC 18.1 (H) 4.0 - 10.5 K/uL   RBC 5.12 4.22 - 5.81 MIL/uL   Hemoglobin 14.9 13.0 - 17.0 g/dL   HCT 65.7 84.6 - 96.2 %   MCV 84.6 80.0 - 100.0 fL   MCH 29.1 26.0 - 34.0 pg   MCHC 34.4 30.0 - 36.0 g/dL   RDW 95.2 84.1 - 32.4 %   Platelets 346 150 -  400 K/uL   nRBC 0.0 0.0 - 0.2 %    Comment: Performed at [facility], [address] [place], [place] [address]  Lactic acid, plasma     Status: None   Collection Time: 05/05/23  3:23 AM  Result Value Ref Range   Lactic Acid, Venous 1.2 0.5 - 1.9 mmol/L    Comment: Performed at [facility], [address] [place], [place] [address]  Phosphorus     Status: None   Collection Time: 05/05/23  3:23 AM  Result Value Ref Range   Phosphorus 3.9 2.5 - 4.6 mg/dL    Comment: Performed at [facility], [address] [place], [place] [address]  Hemoglobin A1c     Status: Abnormal   Collection Time: 05/05/23  3:23 AM  Result Value Ref Range   Hgb A1c MFr Bld 5.7 (H) 4.8 - 5.6 %    Comment: (NOTE) Pre diabetes:          5.7%-6.4%  Diabetes:              >6.4%  Glycemic control for   <7.0% adults with diabetes    Mean Plasma Glucose 116.89 mg/dL    Comment: Performed at [facility], [address] [place], [place] [address]  Beta-hydroxybutyric acid     Status: None   Collection Time: 05/05/23  3:23 AM  Result Value Ref Range   Beta-Hydroxybutyric Acid 0.06 0.05 - 0.27 mmol/L    Comment: Performed at [facility], [address] [place], [place] [address]  Magnesium     Status: None   Collection Time: 05/05/23  3:23 AM  Result Value Ref Range   Magnesium 2.0 1.7 -  2.4 mg/dL    Comment: Performed at [facility], [address] [place], [place] [address]   ECHOCARDIOGRAM COMPLETE  Result Date: 05/05/2023    ECHOCARDIOGRAM REPORT   Patient Name:   SULIMAN TERMINI [facility] Date of Exam: 05/05/2023 Medical Rec #:  [id number]       Height:       64.0 in Accession #:    [id number]      Weight:       100.0 lb Date of Birth:  1986/02/28       BSA:          1.457 m Patient Age:    36 years        BP:           106/67 mmHg Patient Gender: M               HR:           83 bpm. Exam Location:  Inpatient Procedure: 2D Echo, Cardiac Doppler and Color Doppler Indications:     R07.9* Chest pain, unspecified  History:        Patient has no prior history of Echocardiogram examinations.  Sonographer:    Harriette Bouillon RDCS Referring Phys: [id number] CAROLYN GUILLOUD IMPRESSIONS  1. Left ventricular ejection fraction, by estimation, is 60 to 65%. The left ventricle has normal function. The left ventricle has no regional wall motion abnormalities. Left ventricular diastolic parameters were normal.  2. Right ventricular systolic function is normal. The right ventricular size is normal. Tricuspid regurgitation signal is inadequate for assessing PA pressure.  3. The mitral valve is normal in structure. No evidence of mitral valve regurgitation.  4. The aortic valve was not well visualized. Aortic valve regurgitation is not visualized. No aortic stenosis is present.  5. The inferior vena cava is normal in size with greater than 50% respiratory variability, suggesting right atrial pressure of 3 mmHg. FINDINGS  Left Ventricle: Left ventricular ejection fraction, by estimation, is 60 to 65%. The left ventricle has normal function. The left ventricle has no regional wall motion abnormalities. The left ventricular internal cavity size was normal in size. There is  no left ventricular hypertrophy. Left ventricular diastolic parameters were normal. Right Ventricle: The right ventricular size is normal. No increase in right ventricular wall thickness. Right ventricular systolic function is normal. Tricuspid regurgitation signal is inadequate for assessing PA pressure. Left Atrium: Left atrial size was normal in size. Right Atrium: Right atrial size was normal in size. Pericardium: There is no evidence of pericardial effusion. Mitral Valve: The mitral valve is normal in structure. No evidence of mitral valve regurgitation. Tricuspid Valve: The tricuspid valve is normal in structure. Tricuspid valve regurgitation is not demonstrated. Aortic Valve: The aortic valve was not well visualized. Aortic valve  regurgitation is not visualized. No aortic stenosis is present. Pulmonic Valve: The pulmonic valve was not well visualized. Pulmonic valve regurgitation is not visualized. Aorta: The aortic root is normal in size and structure. Venous: The inferior vena cava is normal in size with greater than 50% respiratory variability, suggesting right atrial pressure of 3 mmHg. IAS/Shunts: The interatrial septum was not well visualized.  LEFT VENTRICLE PLAX 2D LVIDd:         4.00 cm   Diastology LVIDs:         2.30 cm   LV e' medial:    14.60 cm/s LV PW:         0.80 cm   LV  E/e' medial:  4.6 LV IVS:        0.80 cm   LV e' lateral:   13.20 cm/s LVOT diam:     1.90 cm   LV E/e' lateral: 5.1 LV SV:         60 LV SV Index:   41 LVOT Area:     2.84 cm  RIGHT VENTRICLE         IVC TAPSE (M-mode): 2.1 cm  IVC diam: 1.60 cm LEFT ATRIUM           Index        RIGHT ATRIUM           Index LA diam:      2.30 cm 1.58 cm/m   RA Area:     12.40 cm LA Vol (A4C): 31.9 ml 21.89 ml/m  RA Volume:   31.70 ml  21.75 ml/m  AORTIC VALVE LVOT Vmax:   96.00 cm/s LVOT Vmean:  63.900 cm/s LVOT VTI:    0.213 m  AORTA Ao Root diam: 3.10 cm Ao Asc diam:  3.10 cm MITRAL VALVE MV Area (PHT): 2.99 cm    SHUNTS MV Decel Time: 254 msec    Systemic VTI:  0.21 m MV E velocity: 66.80 cm/s  Systemic Diam: 1.90 cm MV A velocity: 35.10 cm/s MV E/A ratio:  1.90 Epifanio Lesches MD Electronically signed by Epifanio Lesches MD Signature Date/Time: 05/05/2023/1:24:19 PM    Final    CT MAXILLOFACIAL W CONTRAST  Result Date: 05/05/2023 CLINICAL DATA:  Provided history: Dental abscess. EXAM: CT MAXILLOFACIAL WITH CONTRAST TECHNIQUE: Multidetector CT imaging of the maxillofacial structures was performed with intravenous contrast. Multiplanar CT image reconstructions were also generated. RADIATION DOSE REDUCTION: This exam was performed according to the departmental dose-optimization program which includes automated exposure control, adjustment of the mA  and/or kV according to patient size and/or use of iterative reconstruction technique. CONTRAST:  75mL OMNIPAQUE IOHEXOL 350 MG/ML SOLN COMPARISON:  None. FINDINGS: Osseous: Very poor dentition with numerous carious and fractured teeth. Periapical lucency surrounding numerous teeth (some of which is expansile in appearance), compatible with periodontal disease and possible dental abscesses. Periapical lucency is most prominent surrounding the left maxillary lateral incisor, left maxillary canine, left maxillary first premolar, left maxillary first molar, left maxillary third molar, right maxillary first premolar, right maxillary second molar, right maxillary third molar, left mandibular medial incisor left mandibular second premolar, left mandibular first molar, left mandibular third molar, right mandibular medial incisor, right mandibular molars. Orbits: No orbital mass or acute orbital finding. Sinuses: Mild mucosal thickening within the inferior maxillary sinuses, bilaterally. Mild mucosal thickening within a right ethmoid air cell. Soft tissues: 1.4 x 1.2 cm ovoid low-attenuation subcutaneous lesion along the left aspect of the nose (series 3, image 53). Facial skin thickening and edema bilaterally (right greater than left). Limited intracranial: No evidence of an acute intracranial abnormality within the field of view. IMPRESSION: 1. Very poor dentition with numerous carious and fractured teeth. Periapical lucency surrounding numerous teeth (some which is expansile appearance), compatible periodontal disease and possible dental abscesses, as described. 2. Facial skin thickening and edema bilaterally (right greater than left) suspicious for cellulitis. 3. 1.4 x 1.2 cm ovoid low-attenuation subcutaneous lesion along the left aspect of the nose. Although nonspecific, this could reflect an abscess. Direct examination recommended. 4. Mild mucosal thickening within the inferior maxillary sinuses, bilaterally,  potentially reflecting odontogenic sinusitis. 5. Mild right ethmoid sinus mucosal thickening. Electronically Signed   By:  Jackey Loge D.O.   On: 05/05/2023 09:08   DG Chest Port 1 View  Result Date: 05/04/2023 CLINICAL DATA:  Chest pain EXAM: PORTABLE CHEST 1 VIEW COMPARISON:  None Available. FINDINGS: The heart size and mediastinal contours are within normal limits. Both lungs are clear. The visualized skeletal structures are unremarkable. IMPRESSION: No active disease. Electronically Signed   By: Larose Hires D.O.   On: 05/04/2023 18:42    Pending Labs Unresulted Labs (From admission, onward)     Start     Ordered   05/06/23 0500  Comprehensive metabolic panel  Tomorrow morning,   R        05/05/23 1104   05/06/23 0500  Phosphorus  Tomorrow morning,   R        05/05/23 1104   05/06/23 0500  Magnesium  Tomorrow morning,   R        05/05/23 1104   05/06/23 0500  CBC  Tomorrow morning,   R        05/05/23 1104   05/05/23 1149  MRSA culture  Once,   R        05/05/23 1148   05/04/23 2210  Blood culture (routine x 2)  BLOOD CULTURE X 2,   R      05/04/23 2209   05/04/23 2202  HIV Antibody (routine testing w rflx)  (HIV Antibody (Routine testing w reflex) panel)  Once,   R        05/04/23 2201   05/04/23 2202  Lactic acid, plasma  STAT Now then every 3 hours,   R      05/04/23 2201   05/04/23 2202  Hepatitis panel, acute  Once,   R        05/04/23 2201   05/04/23 2202  Sedimentation rate  Once,   R        05/04/23 2201   05/04/23 2202  C-reactive protein  Once,   R        05/04/23 2201            Vitals/Pain Today's Vitals   05/05/23 0804 05/05/23 1020 05/05/23 1030 05/05/23 1349  BP:   113/89 104/68  Pulse:   87 87  Resp:   (!) 23 16  Temp:   97.6 F (36.4 C) 98.1 F (36.7 C)  TempSrc:   Oral Oral  SpO2:   94% 97%  Weight:      Height:      PainSc: 0-No pain 0-No pain      Isolation Precautions No active isolations  Medications Medications  nitroGLYCERIN  (NITROSTAT) SL tablet 0.4 mg (0.4 mg Sublingual Given 05/04/23 1837)  rivaroxaban (XARELTO) tablet 10 mg (10 mg Oral Given 05/05/23 1306)  ibuprofen (ADVIL) tablet 600 mg (600 mg Oral Given 05/05/23 1304)  colchicine tablet 0.6 mg (0.6 mg Oral Given 05/05/23 1306)  Ampicillin-Sulbactam (UNASYN) 3 g in sodium chloride 0.9 % 100 mL IVPB (3 g Intravenous New Bag/Given 05/05/23 1619)  nicotine (NICODERM CQ - dosed in mg/24 hr) patch 7 mg (7 mg Transdermal Patch Applied 05/05/23 0748)  QUEtiapine (SEROQUEL) tablet 25 mg (has no administration in time range)  feeding supplement (ENSURE ENLIVE / ENSURE PLUS) liquid 237 mL (has no administration in time range)  mupirocin cream (BACTROBAN) 2 % ( Topical Given 05/05/23 1625)  clonazePAM (KLONOPIN) tablet 0.5 mg (0.5 mg Oral Given 05/05/23 1546)  aspirin chewable tablet 324 mg (324 mg Oral Given 05/04/23 1843)  LORazepam (ATIVAN)  injection 2 mg (2 mg Intravenous Given 05/04/23 1829)  haloperidol lactate (HALDOL) injection 5 mg (5 mg Intramuscular Given 05/04/23 1830)  ondansetron (ZOFRAN-ODT) disintegrating tablet 4 mg (4 mg Oral Given 05/04/23 1838)  Ampicillin-Sulbactam (UNASYN) 3 g in sodium chloride 0.9 % 100 mL IVPB (0 g Intravenous Stopped 05/04/23 2319)  lactated ringers bolus 1,000 mL (0 mLs Intravenous Stopped 05/05/23 0500)  iohexol (OMNIPAQUE) 350 MG/ML injection 75 mL (75 mLs Intravenous Contrast Given 05/05/23 0830)    Mobility walks     Focused Assessments Cardiac Assessment Handoff:  Cardiac Rhythm: Normal sinus rhythm No results found for: "CKTOTAL", "CKMB", "CKMBINDEX", "TROPONINI" No results found for: "DDIMER" Does the Patient currently have chest pain? No    R Recommendations: See Admitting Provider Note  Report given to:   Additional Notes: Patient is on Unasyn very 6 hours. Primary saying acute pericarditis. They think maybe possible oral abscess and poor dental care might be due to his abnormal lab. 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 UDS  positive for THC.

## 2023-05-05 NOTE — ED Notes (Signed)
Admit. MD's at beside.

## 2023-05-05 NOTE — ED Notes (Signed)
Ivc paperwork attached to the clip board in green zone

## 2023-05-05 NOTE — Progress Notes (Signed)
Subjective:  Darius Kennedy is a 37 y.o. male with a PMHx significant for substance use disorder (crystal meth, reports 3 years abstinent; marijuana, current use), ADHD who was admitted for chest pain with concern for acute pericarditis.  He is accompanied by his mom and dad.  Pt was examined at bedside today. He reports he is feeling much better than yesterday. He denies any chest pain; he'd had chest pain yesterday with taking deep breaths, but reports today that this has resolved and he denies any difficulty breathing. He denies current nausea, vomiting; his last episode of vomiting was yesterday on his way from [facility] to the [facility] ED. He denies diarrhea. He denies burning with urination, problems emptying his bladder. No headache, light headedness.   Objective:  Vital signs in last 24 hours: Vitals:   05/05/23 0500 05/05/23 0533 05/05/23 0600 05/05/23 0747  BP: 108/76  109/71 106/67  Pulse: (!) 102  97 91  Resp:   20 20  Temp:  98 F (36.7 C)  97.8 F (36.6 C)  TempSrc:  Oral  Oral  SpO2: 97%  96% 97%  Weight:      Height:       Weight change:   Intake/Output Summary (Last 24 hours) at 05/05/2023 1029 Last data filed at 05/05/2023 0500 Gross per 24 hour  Intake 1200 ml  Output --  Net 1200 ml   Physical Exam General: Pt is lying back in bed, appears older than stated age. No acute distress. HEENT: Pt has poor dentition with teeth either rotted or missing. L nasal bridge with flesh--colored nodule. Cardiovascular: RRR, no murmurs, rubs, gallops. Pulmonary: Normal work of breathing. Lungs clear to auscultation bilaterally. Abdomen: Normal bowel sounds. Soft and nondistended in all four quadrants. No tenderness to palpation; no rebound tenderness, no guarding. Neuro/Psych: Alert and oriented to person, place, event. (Time not formerly assessed.) Normal affect. Perseverates on notion of being shot in head by BB gun, leading to varying pt-reported symptoms of his  head.     Latest Ref Rng & Units 05/05/2023    3:23 AM 05/04/2023    6:43 PM  CMP  Glucose 70 - 99 mg/dL 086  578   BUN 6 - 20 mg/dL 12  8   Creatinine 4.69 - 1.24 mg/dL 6.29  5.28   Sodium 413 - 145 mmol/L 135  137   Potassium 3.5 - 5.1 mmol/L 4.2  3.8   Chloride 98 - 111 mmol/L 98  100   CO2 22 - 32 mmol/L 22  19   Calcium 8.9 - 10.3 mg/dL 9.8  24.4   Total Protein 6.5 - 8.1 g/dL 7.1  8.6   Total Bilirubin 0.3 - 1.2 mg/dL 0.7  0.8   Alkaline Phos 38 - 126 U/L 106  127   AST 15 - 41 U/L 26  27   ALT 0 - 44 U/L 44  54       Latest Ref Rng & Units 05/05/2023    3:23 AM 05/04/2023    6:43 PM  CBC  WBC 4.0 - 10.5 K/uL 18.1  27.9   Hemoglobin 13.0 - 17.0 g/dL 01.0  27.2   Hematocrit 39.0 - 52.0 % 43.3  48.1   Platelets 150 - 400 K/uL 346  405    Lactic Acid: 1.2  Phos: 3.9  Mag: 2.0 Ha1c: 5.7%  B-hydroxybutyric acid: 0.06  Pending: Blood cultures, HIV antibody, Hepatitis panel, CRP, Sed Rate  CXR 7/21: No active  disease.  CT Maxillofacial 7/22: IMPRESSION: 1. Very poor dentition with numerous carious and fractured teeth. Periapical lucency surrounding numerous teeth (some which is expansile appearance), compatible periodontal disease and possible dental abscesses, as described. 2. Facial skin thickening and edema bilaterally (right greater than left) suspicious for cellulitis. 3. 1.4 x 1.2 cm ovoid low-attenuation subcutaneous lesion along the left aspect of the nose. Although nonspecific, this could reflect an abscess. Direct examination recommended. 4. Mild mucosal thickening within the inferior maxillary sinuses, bilaterally, potentially reflecting odontogenic sinusitis. 5. Mild right ethmoid sinus mucosal thickening.  Echocardiogram 7/22: Pending  Assessment/Plan:  Principal Problem:   Chest pain Active Problems:   Acute pericarditis  Darius Kennedy is a 37 y.o. male with a PMHx significant for substance use disorder (crystal meth, reports 3 years abstinent;  marijuana, current use), ADHD who was admitted for chest pain with concern for acute pericarditis.  Acute Pericarditis Pt initially presented with CP and N/V, had EKG with ST elevations in multiple leads and PR depressions with leukocytosis with neutrophil predominance. ACS ruled out given negative troponins x2. Cards consulted, after conversation with pt + family opted not to pursue urgent cath. Suspect chest pain is due to acute pericarditis, etiology yet unknown but most likely bacterial given severe periodontal disease with possible abscesses seen on CT done 7/22. Echocardiogram done 7/22 has results still pending; bedside US without effusion previously. Pt started on empiric Unasyn on 7/21 with blood cultures pending. Can also consider viral, idiopathic causes of pericarditis; less likely fungal, autoimmune. - Continue to follow with Cardiology. Recommendations: - Rx: NSAID (ibuprofen 600mg  q8h) and colchicine 0.6mg  every day (dose reduced for weight). Duration of NSAIDS until syx resolve (1-2 weeks), then taper. Colchicine 1-3 months, tbd pending course - Continue IV Unasyn - Follow-up Blood cultures - Follow-up HIV antibody, Hepatitis panel, CRP, Sed Rate    Prediabetes Poor nutrition UA collected on admission abnormal with protein 30 and glucose >500; also initially with mild anion gap metabolic acidosis, since resolved. No hx of diabetes. Ha1c 5.7% with B-hydroxybutyric acid of 0.06 on 7/22 meaning very low likelihood that initial vomiting related to DKA, no concern at this time. Mag and Phos on 7/22 wnl. At baseline, pt has poor po intake and frequently skips meals; unsure if related to poor dentition. At cardiology's recommendation, supplementing nutrition with Ensure BID between meals. Will consult nutrition for further evaluation. Will watch for refeeding syndrome. Will encourage pt to establish with PCP outpatient for further management of prediabetes. - Nutrition consult - F/u outpatient  with PCP for prediabetes management   Anxiety Possible paranoia Initially presented with acute psychosis and required administration of haloperidol and midazolam; was made IVC given AMS/Psychosis. He also has increased levels of anxiety in a hospital setting, especially regarding needles. He continues to report varying symptoms in his head such as mouth pain due to "BB injury," similar to what was reported in prior note, without physical exam findings to support such an injury. Consult to psych placed for evaluation of paranoia and perseverance of BB symptoms. QTc 381; started pt on Seroquel 25 mg daily at bedtime to help with anxiety and paranoia symptoms. - Psych consult - Seroquel 25 mg daily  L Nasal Bridge Mass Suspected Facial Cellulitis Leukocytosis  Labs at presentation with thrombocytosis, borderline Hgb count of 16.5, elevated total protein and albumin, and leukocytosis with WBC 27.9. Repeat labs on 7/22 show resolution of thrombocytosis, normal range total protein and albumin, improvement in Hgb to 14.9,  improvement of leukocytosis to 18.1. No concern for underlying malignancy or polycythemia vera at this time; suspect labs initially elevated due to hemoconcentration in setting of poor po intake and vomiting. Suspect leukocytosis due to HEENT infections. CT Maxillofacial done 7/22 showed 1.4 x 1.2 cm ovoid low-attenuation subcutaneous lesion along the left aspect of the nose which, although nonspecific, could reflect an abscess. Also showed facial skin thickening and edema bilaterally (right greater than left) suspicious for cellulitis. Pt already on IV Unasyn. Will evaluate for MRSA with swab to inform abx decisions. Will continue trending white count. - MRSA nasal swab - Trend CBC  Tobacco Use Disorder Marijuana Use Hx Methamphetamine Use Pt reports hx of methamphetamine use (crystal meth), reports last use was 3 years ago. Denies current/recent use. Does report recent use of  marijuana, consistent with UDS which was positive for THC only. Pt also reports longstanding history of tobacco use with smoking cigarettes. Will provide nicotine patch. - Nicotine patch  Periodontal Disease with possible abscesses Pt has longstanding history of poor dentition. Previously was going to undergo dental surgery in 10/2022, but pt refused IV and left procedure. CT Maxillofacial done 7/22 showed very poor dentition with numerous carious and fractured teeth; periapical lucency surrounding numerous teeth (some which is expansile appearance), compatible periodontal disease and possible dental abscesses. Possible abscesses are a likely nidus for pericarditis. - Encourage pt to follow-up with dentist outpatient  Code Status: Full Diet: Regular with Ensure Supplement BID IV Fluid: None DVT Prophylaxis: Xarelto  Pt currently IVC'd until 05/11/2023 for AMS/Psychosis.   LOS: 1 day   Governor Rooks, Medical Student  05/05/2023, 10:29 AM

## 2023-05-05 NOTE — Progress Notes (Addendum)
Rounding Note    Patient Name: Darius Kennedy Date of Encounter: 05/05/2023  [facility] Cardiologist: None   Subjective   No complaints.  Not having any more chest pain and not vomiting anymore.  Mentation seems to be back to baseline.  Parents accompanied with him at the bedside  Inpatient Medications    Scheduled Meds:  colchicine  0.6 mg Oral Daily   ibuprofen  600 mg Oral TID   nicotine  7 mg Transdermal Daily   QUEtiapine  25 mg Oral QHS   rivaroxaban  10 mg Oral Daily   Continuous Infusions:  ampicillin-sulbactam (UNASYN) IV 3 g (05/05/23 1039)   PRN Meds: nitroGLYCERIN   Vital Signs    Vitals:   05/05/23 0533 05/05/23 0600 05/05/23 0747 05/05/23 1030  BP:  109/71 106/67 113/89  Pulse:  97 91 87  Resp:  20 20 (!) 23  Temp: 98 F (36.7 C)  97.8 F (36.6 C) 97.6 F (36.4 C)  TempSrc: Oral  Oral Oral  SpO2:  96% 97% 94%  Weight:      Height:        Intake/Output Summary (Last 24 hours) at 05/05/2023 1104 Last data filed at 05/05/2023 0500 Gross per 24 hour  Intake 1200 ml  Output --  Net 1200 ml      05/04/2023    5:13 PM 11/01/2022    8:28 AM 06/18/2022    2:07 PM  Last 3 Weights  Weight (lbs) 100 lb 113 lb 103 lb 12.8 oz  Weight (kg) 45.36 kg 51.256 kg 47.083 kg      Telemetry     Sinus rhythm heart rate 80s with ST elevation.- Personally Reviewed  ECG    Normal sinus rhythm with ST elevations inferiorly and anterolaterally. .- Personally Reviewed  Physical Exam   GEN: No acute distress.   Neck: No JVD Cardiac: RRR, no murmurs, rubs, or gallops.  Respiratory: Clear to auscultation bilaterally. GI: Soft, nontender, non-distended  MS: No edema; No deformity. Neuro:  Nonfocal  Psych: Normal affect   Labs    High Sensitivity Troponin:   Recent Labs  Lab 05/04/23 1843 05/04/23 2046  TROPONINIHS 2 4     Chemistry Recent Labs  Lab 05/04/23 1843 05/05/23 0323  NA 137 135  K 3.8 4.2  CL 100 98  CO2 19* 22   GLUCOSE 168* 139*  BUN 8 12  CREATININE 0.86 0.94  CALCIUM 10.6* 9.8  MG  --  2.0  PROT 8.6* 7.1  ALBUMIN 5.4* 4.1  AST 27 26  ALT 54* 44  ALKPHOS 127* 106  BILITOT 0.8 0.7  GFRNONAA >60 >60  ANIONGAP 18* 15    Lipids No results for input(s): "CHOL", "TRIG", "HDL", "LABVLDL", "LDLCALC", "CHOLHDL" in the last 168 hours.  Hematology Recent Labs  Lab 05/04/23 1843 05/05/23 0323  WBC 27.9* 18.1*  RBC 5.58 5.12  HGB 16.5 14.9  HCT 48.1 43.3  MCV 86.2 84.6  MCH 29.6 29.1  MCHC 34.3 34.4  RDW 13.5 13.5  PLT 405* 346   Thyroid No results for input(s): "TSH", "FREET4" in the last 168 hours.  BNPNo results for input(s): "BNP", "PROBNP" in the last 168 hours.  DDimer No results for input(s): "DDIMER" in the last 168 hours.   Radiology    CT MAXILLOFACIAL W CONTRAST  Result Date: 05/05/2023 CLINICAL DATA:  Provided history: Dental abscess. EXAM: CT MAXILLOFACIAL WITH CONTRAST TECHNIQUE: Multidetector CT imaging of the maxillofacial structures was  performed with intravenous contrast. Multiplanar CT image reconstructions were also generated. RADIATION DOSE REDUCTION: This exam was performed according to the departmental dose-optimization program which includes automated exposure control, adjustment of the mA and/or kV according to patient size and/or use of iterative reconstruction technique. CONTRAST:  75mL OMNIPAQUE IOHEXOL 350 MG/ML SOLN COMPARISON:  None. FINDINGS: Osseous: Very poor dentition with numerous carious and fractured teeth. Periapical lucency surrounding numerous teeth (some of which is expansile in appearance), compatible with periodontal disease and possible dental abscesses. Periapical lucency is most prominent surrounding the left maxillary lateral incisor, left maxillary canine, left maxillary first premolar, left maxillary first molar, left maxillary third molar, right maxillary first premolar, right maxillary second molar, right maxillary third molar, left mandibular  medial incisor left mandibular second premolar, left mandibular first molar, left mandibular third molar, right mandibular medial incisor, right mandibular molars. Orbits: No orbital mass or acute orbital finding. Sinuses: Mild mucosal thickening within the inferior maxillary sinuses, bilaterally. Mild mucosal thickening within a right ethmoid air cell. Soft tissues: 1.4 x 1.2 cm ovoid low-attenuation subcutaneous lesion along the left aspect of the nose (series 3, image 53). Facial skin thickening and edema bilaterally (right greater than left). Limited intracranial: No evidence of an acute intracranial abnormality within the field of view. IMPRESSION: 1. Very poor dentition with numerous carious and fractured teeth. Periapical lucency surrounding numerous teeth (some which is expansile appearance), compatible periodontal disease and possible dental abscesses, as described. 2. Facial skin thickening and edema bilaterally (right greater than left) suspicious for cellulitis. 3. 1.4 x 1.2 cm ovoid low-attenuation subcutaneous lesion along the left aspect of the nose. Although nonspecific, this could reflect an abscess. Direct examination recommended. 4. Mild mucosal thickening within the inferior maxillary sinuses, bilaterally, potentially reflecting odontogenic sinusitis. 5. Mild right ethmoid sinus mucosal thickening. Electronically Signed   By: Jackey Loge D.O.   On: 05/05/2023 09:08   DG Chest Port 1 View  Result Date: 05/04/2023 CLINICAL DATA:  Chest pain EXAM: PORTABLE CHEST 1 VIEW COMPARISON:  None Available. FINDINGS: The heart size and mediastinal contours are within normal limits. Both lungs are clear. The visualized skeletal structures are unremarkable. IMPRESSION: No active disease. Electronically Signed   By: Larose Hires D.O.   On: 05/04/2023 18:42    Cardiac Studies   Echocardiogram 05/05/2023 pending  Patient Profile     37 y.o. male with past medical history significant for drug abuse  (reports sobriety for the last couple years), anorexia.  Patient initially presented with chest pain and severe vomiting.  Initially seen at [facility] and was found to have diffuse ST elevations.  Code STEMI was activated however canceled after being evaluated here.  Bedside echocardiogram showed normal biventricular function with normal wall motion.  Troponins negative.  Catheterization was considered however patient is deathly afraid of needles and refused diagnosis thought more to be related to pericarditis than ACS.  Additionally patient presented very agitated and acutely psychotic requiring administration of haloperidol and midazolam.  Assessment & Plan    Acute pericarditis Symptoms are pleuritic in nature and relieved when sitting forward.  No complaints now.  Presentation seems very suspicious for pericarditis given elevated WBC, severe nausea, question of oral abscess/poor dentition.  EKG shows diffuse ST elevations.  Troponins are negative. Bedside echocardiogram showed normal RV/LV function without any regional wall motion abnormalities.    ESR/CRP pending Echo cardiogram pending, bedside echocardiogram did not note any pericardial effusions He has been started on ibuprofen 600 mg every  8 and colchicine 0.6 mg every day.  Defer specific timeline to MD Continue to workup secondary causes, blood cultures pending, HIV, per primary team. He has been started on Unasyn  Anorexia Very poor oral intake for the last 4+ years.  Likely also attributing to abnormal lab work.  Will give diet back and will also order Ensure.  History of polysubstance abuse Denies recent use of methamphetamine use.  Does report using THC.  UDS positive for THC.  Did not partake in IV drug use due to fear of needles.    For questions or updates, please contact [facility] Please consult [email or web address] for contact info under        Signed, Abagail Kitchens, PA-C  05/05/2023, 11:04 AM    Agree with  above  71 year old with acute pericarditis.  Diffuse ST segment elevation.  Troponins are normal.  No evidence of myocarditis.  ESR and CRP pending.  Echocardiogram pending.  No evidence of pericardial effusion at bedside. - Agree with ibuprofen 600 mg every 8 hours as well as colchicine 0.6 mg daily. - Further possible infectious workup per primary team.  Donato Schultz, MD

## 2023-05-05 NOTE — Progress Notes (Signed)
Provider on call paged. Provider stated that patient is not IVCd due to his mentation being better.

## 2023-05-05 NOTE — Consult Note (Signed)
  Psychiatry consult received today for experiencing paranoia. Due to several concurrent activities (echo/cards),  unable to see the patient today. On a positive note, the patient's mental status and paranoia seemed to be improving throughout the day, which is encouraging given the nature of the consult.  Psychiatry consult will attempt to reassess tomorrow.

## 2023-05-05 NOTE — ED Notes (Signed)
Lunch tray ordered for pt.

## 2023-05-05 NOTE — Consult Note (Signed)
ENT CONSULT:  Reason for Consult: Abscess  Referring Physician:  Reymundo Poll, MD  HPI: Darius Kennedy is an 37 y.o. male with past medical history significant for polysubstance abuse admitted for chest pain with concern for acute pericarditis.  Patient was noted to have swelling over the left nasal bridge, and imaging was performed demonstrating a 1.4 x 1.2 cm ovoid low-attenuation subcutaneous lesion along the left aspect of the nose, nonspecific, possibly reflecting an abscess.  ENT consulted for evaluation.   Past Medical History:  Diagnosis Date   ADHD (attention deficit hyperactivity disorder)    Drug abuse (HCC)    Seasonal allergies     Past Surgical History:  Procedure Laterality Date   benign mass  1991   18 mo- removed from flank   TYMPANOSTOMY TUBE PLACEMENT Bilateral     Family History  Problem Relation Age of Onset   Diabetes Mother    Hyperlipidemia Mother    Hypertension Mother    Heart attack Mother    Diabetes Father    Hypertension Brother    Heart attack Maternal Uncle 78   Schizophrenia Paternal Uncle        paranoid   Bipolar disorder Maternal Grandmother    Heart attack Maternal Grandmother    Lung cancer Paternal Grandfather        small cell   Heart attack Paternal Grandfather        resulted in death   Sudden death Neg Hx     Social History:  reports that he has been smoking cigarettes. He has never used smokeless tobacco. He reports current drug use. Frequency: 7.00 times per week. Drug: Marijuana. He reports that he does not drink alcohol.  Allergies: No Known Allergies  Medications: I have reviewed the patient's current medications.  Results for orders placed or performed during the hospital encounter of 05/04/23 (from the past 48 hour(s))  CBC with Differential/Platelet     Status: Abnormal   Collection Time: 05/04/23  6:43 PM  Result Value Ref Range   WBC 27.9 (H) 4.0 - 10.5 K/uL   RBC 5.58 4.22 - 5.81 MIL/uL   Hemoglobin  16.5 13.0 - 17.0 g/dL   HCT 60.4 54.0 - 98.1 %   MCV 86.2 80.0 - 100.0 fL   MCH 29.6 26.0 - 34.0 pg   MCHC 34.3 30.0 - 36.0 g/dL   RDW 19.1 47.8 - 29.5 %   Platelets 405 (H) 150 - 400 K/uL   nRBC 0.0 0.0 - 0.2 %   Neutrophils Relative % 82 %   Neutro Abs 22.9 (H) 1.7 - 7.7 K/uL   Lymphocytes Relative 9 %   Lymphs Abs 2.5 0.7 - 4.0 K/uL   Monocytes Relative 7 %   Monocytes Absolute 2.0 (H) 0.1 - 1.0 K/uL   Eosinophils Relative 0 %   Eosinophils Absolute 0.0 0.0 - 0.5 K/uL   Basophils Relative 1 %   Basophils Absolute 0.1 0.0 - 0.1 K/uL   Immature Granulocytes 1 %   Abs Immature Granulocytes 0.29 (H) 0.00 - 0.07 K/uL    Comment: Performed at [facility], [address], [place], [place] [address]  Protime-INR     Status: None   Collection Time: 05/04/23  6:43 PM  Result Value Ref Range   Prothrombin Time 13.8 11.4 - 15.2 seconds   INR 1.0 0.8 - 1.2    Comment: (NOTE) INR goal varies based on device and disease states. Performed at [facility],  [address] [place], [place] [address]   APTT     Status: Abnormal   Collection Time: 05/04/23  6:43 PM  Result Value Ref Range   aPTT 23 (L) 24 - 36 seconds    Comment: Performed at [facility], [address], [place], [place] [address]  Comprehensive metabolic panel     Status: Abnormal   Collection Time: 05/04/23  6:43 PM  Result Value Ref Range   Sodium 137 135 - 145 mmol/L   Potassium 3.8 3.5 - 5.1 mmol/L   Chloride 100 98 - 111 mmol/L   CO2 19 (L) 22 - 32 mmol/L   Glucose, Bld 168 (H) 70 - 99 mg/dL    Comment: Glucose reference range applies only to samples taken after fasting for at least 8 hours.   BUN 8 6 - 20 mg/dL   Creatinine, Ser 0.98 0.61 - 1.24 mg/dL   Calcium 11.9 (H) 8.9 - 10.3 mg/dL   Total Protein 8.6 (H) 6.5 - 8.1 g/dL   Albumin 5.4 (H) 3.5 - 5.0 g/dL   AST 27 15 - 41 U/L   ALT 54 (H) 0 - 44 U/L   Alkaline Phosphatase 127 (H) 38 - 126 U/L    Total Bilirubin 0.8 0.3 - 1.2 mg/dL   GFR, Estimated >14 >78 mL/min    Comment: (NOTE) Calculated using the CKD-EPI Creatinine Equation (2021)    Anion gap 18 (H) 5 - 15    Comment: Performed at [facility], [address], [place], [place] [address]  Troponin I (High Sensitivity)     Status: None   Collection Time: 05/04/23  6:43 PM  Result Value Ref Range   Troponin I (High Sensitivity) 2 <18 ng/L    Comment: (NOTE) Elevated high sensitivity troponin I (hsTnI) values and significant  changes across serial measurements may suggest ACS but many other  chronic and acute conditions are known to elevate hsTnI results.  Refer to the "Links" section for chest pain algorithms and additional  guidance. Performed at [facility], [address] [place], [place] [address]   Lipid panel     Status: Abnormal   Collection Time: 05/04/23  6:43 PM  Result Value Ref Range   Cholesterol 138 0 - 200 mg/dL   Triglycerides 578 (H) <150 mg/dL   HDL 39 (L) >46 mg/dL   Total CHOL/HDL Ratio 3.5 RATIO   VLDL 34 0 - 40 mg/dL   LDL Cholesterol 65 0 - 99 mg/dL    Comment:        Total Cholesterol/HDL:CHD Risk Coronary Heart Disease Risk Table                     Men   Women  1/2 Average Risk   3.4   3.3  Average Risk       5.0   4.4  2 X Average Risk   9.6   7.1  3 X Average Risk  23.4   11.0        Use the calculated Patient Ratio above and the CHD Risk Table to determine the patient's CHD Risk.        ATP III CLASSIFICATION (LDL):  <100     mg/dL   Optimal  962-952  mg/dL   Near or Above                    Optimal  130-159  mg/dL   Borderline  841-324  mg/dL   High  >161     mg/dL   Very High Performed at [facility], [address], [place], [place] [address]   Troponin I (High Sensitivity)     Status: None   Collection Time: 05/04/23  8:46 PM  Result Value Ref Range   Troponin I (High Sensitivity) 4 <18 ng/L    Comment: (NOTE) Elevated  high sensitivity troponin I (hsTnI) values and significant  changes across serial measurements may suggest ACS but many other  chronic and acute conditions are known to elevate hsTnI results.  Refer to the "Links" section for chest pain algorithms and additional  guidance. Performed at [facility], [address], [place], [place] [address]   Urinalysis, Complete w Microscopic -Urine, Clean Catch     Status: Abnormal   Collection Time: 05/05/23 12:30 AM  Result Value Ref Range   Color, Urine AMBER (A) YELLOW    Comment: BIOCHEMICALS MAY BE AFFECTED BY COLOR   APPearance CLEAR CLEAR   Specific Gravity, Urine 1.033 (H) 1.005 - 1.030   pH 5.0 5.0 - 8.0   Glucose, UA >=500 (A) NEGATIVE mg/dL   Hgb urine dipstick NEGATIVE NEGATIVE   Bilirubin Urine NEGATIVE NEGATIVE   Ketones, ur NEGATIVE NEGATIVE mg/dL   Protein, ur 30 (A) NEGATIVE mg/dL   Nitrite NEGATIVE NEGATIVE   Leukocytes,Ua NEGATIVE NEGATIVE   RBC / HPF 0-5 0 - 5 RBC/hpf   WBC, UA 0-5 0 - 5 WBC/hpf   Bacteria, UA NONE SEEN NONE SEEN   Squamous Epithelial / HPF 0-5 0 - 5 /HPF   Mucus PRESENT    Hyaline Casts, UA PRESENT     Comment: Performed at [facility], [address] [place], [place] [address]  Rapid urine drug screen (hospital performed)     Status: Abnormal   Collection Time: 05/05/23 12:30 AM  Result Value Ref Range   Opiates NONE DETECTED NONE DETECTED   Cocaine NONE DETECTED NONE DETECTED   Benzodiazepines NONE DETECTED NONE DETECTED   Amphetamines NONE DETECTED NONE DETECTED   Tetrahydrocannabinol POSITIVE (A) NONE DETECTED   Barbiturates NONE DETECTED NONE DETECTED    Comment: (NOTE) DRUG SCREEN FOR MEDICAL PURPOSES ONLY.  IF CONFIRMATION IS NEEDED FOR ANY PURPOSE, NOTIFY LAB WITHIN 5 DAYS.  LOWEST DETECTABLE LIMITS FOR URINE DRUG SCREEN Drug Class                     Cutoff (ng/mL) Amphetamine and metabolites    1000 Barbiturate and metabolites    200 Benzodiazepine                  200 Opiates and metabolites        300 Cocaine and metabolites        300 THC                            50 Performed at [facility], [address], [place], [place] [address]   Comprehensive metabolic panel     Status: Abnormal   Collection Time: 05/05/23  3:23 AM  Result Value Ref Range   Sodium 135 135 - 145 mmol/L   Potassium 4.2 3.5 - 5.1 mmol/L   Chloride 98 98 - 111 mmol/L   CO2 22 22 - 32 mmol/L   Glucose, Bld 139 (H) 70 - 99 mg/dL    Comment: Glucose reference range applies only  to samples taken after fasting for at least 8 hours.   BUN 12 6 - 20 mg/dL   Creatinine, Ser 7.82 0.61 - 1.24 mg/dL   Calcium 9.8 8.9 - 95.6 mg/dL   Total Protein 7.1 6.5 - 8.1 g/dL   Albumin 4.1 3.5 - 5.0 g/dL   AST 26 15 - 41 U/L   ALT 44 0 - 44 U/L   Alkaline Phosphatase 106 38 - 126 U/L   Total Bilirubin 0.7 0.3 - 1.2 mg/dL   GFR, Estimated >21 >30 mL/min    Comment: (NOTE) Calculated using the CKD-EPI Creatinine Equation (2021)    Anion gap 15 5 - 15    Comment: Performed at [facility], [address], [place], [place] [address]  CBC     Status: Abnormal   Collection Time: 05/05/23  3:23 AM  Result Value Ref Range   WBC 18.1 (H) 4.0 - 10.5 K/uL   RBC 5.12 4.22 - 5.81 MIL/uL   Hemoglobin 14.9 13.0 - 17.0 g/dL   HCT 46.9 62.9 - 52.8 %   MCV 84.6 80.0 - 100.0 fL   MCH 29.1 26.0 - 34.0 pg   MCHC 34.4 30.0 - 36.0 g/dL   RDW 41.3 24.4 - 01.0 %   Platelets 346 150 - 400 K/uL   nRBC 0.0 0.0 - 0.2 %    Comment: Performed at [facility], [address], [place], [place] [address]  Lactic acid, plasma     Status: None   Collection Time: 05/05/23  3:23 AM  Result Value Ref Range   Lactic Acid, Venous 1.2 0.5 - 1.9 mmol/L    Comment: Performed at [facility], [address], [place], [place] [address]  Phosphorus     Status: None   Collection Time: 05/05/23  3:23 AM  Result Value Ref Range   Phosphorus 3.9 2.5 - 4.6 mg/dL    Comment: Performed at [facility], [address] [place], [place] [address]  Hemoglobin A1c     Status: Abnormal   Collection Time: 05/05/23  3:23 AM  Result Value Ref Range   Hgb A1c MFr Bld 5.7 (H) 4.8 - 5.6 %    Comment: (NOTE) Pre diabetes:          5.7%-6.4%  Diabetes:              >6.4%  Glycemic control for   <7.0% adults with diabetes    Mean Plasma Glucose 116.89 mg/dL    Comment: Performed at [facility], [address], [place], [place] [address]  Beta-hydroxybutyric acid     Status: None   Collection Time: 05/05/23  3:23 AM  Result Value Ref Range   Beta-Hydroxybutyric Acid 0.06 0.05 - 0.27 mmol/L    Comment: Performed at [facility], [address] [place], [place] [address]  Magnesium     Status: None   Collection Time: 05/05/23  3:23 AM  Result Value Ref Range   Magnesium 2.0 1.7 - 2.4 mg/dL    Comment: Performed at [facility], [address], [place], [place] [address]    ECHOCARDIOGRAM COMPLETE  Result Date: 05/05/2023    ECHOCARDIOGRAM REPORT   Patient Name:   LENNOX DOLBERRY [facility] Date of Exam: 05/05/2023 Medical Rec #:  [id number]       Height:       64.0 in Accession #:    [id number]  Weight:       100.0 lb Date of Birth:  [birth year]       BSA:          1.457 m Patient Age:    36 years        BP:           106/67 mmHg Patient Gender: M               HR:           83 bpm. Exam Location:  Inpatient Procedure: 2D Echo, Cardiac Doppler and Color Doppler Indications:    R07.9* Chest pain, unspecified  History:        Patient has no prior history of Echocardiogram examinations.  Sonographer:    Harriette Bouillon RDCS Referring Phys: [id number] CAROLYN GUILLOUD IMPRESSIONS  1. Left ventricular ejection fraction, by estimation, is 60 to 65%. The left ventricle has normal function. The left ventricle has no regional wall motion abnormalities. Left ventricular diastolic parameters were normal.  2. Right ventricular systolic function is normal. The right ventricular size is normal.  Tricuspid regurgitation signal is inadequate for assessing PA pressure.  3. The mitral valve is normal in structure. No evidence of mitral valve regurgitation.  4. The aortic valve was not well visualized. Aortic valve regurgitation is not visualized. No aortic stenosis is present.  5. The inferior vena cava is normal in size with greater than 50% respiratory variability, suggesting right atrial pressure of 3 mmHg. FINDINGS  Left Ventricle: Left ventricular ejection fraction, by estimation, is 60 to 65%. The left ventricle has normal function. The left ventricle has no regional wall motion abnormalities. The left ventricular internal cavity size was normal in size. There is  no left ventricular hypertrophy. Left ventricular diastolic parameters were normal. Right Ventricle: The right ventricular size is normal. No increase in right ventricular wall thickness. Right ventricular systolic function is normal. Tricuspid regurgitation signal is inadequate for assessing PA pressure. Left Atrium: Left atrial size was normal in size. Right Atrium: Right atrial size was normal in size. Pericardium: There is no evidence of pericardial effusion. Mitral Valve: The mitral valve is normal in structure. No evidence of mitral valve regurgitation. Tricuspid Valve: The tricuspid valve is normal in structure. Tricuspid valve regurgitation is not demonstrated. Aortic Valve: The aortic valve was not well visualized. Aortic valve regurgitation is not visualized. No aortic stenosis is present. Pulmonic Valve: The pulmonic valve was not well visualized. Pulmonic valve regurgitation is not visualized. Aorta: The aortic root is normal in size and structure. Venous: The inferior vena cava is normal in size with greater than 50% respiratory variability, suggesting right atrial pressure of 3 mmHg. IAS/Shunts: The interatrial septum was not well visualized.  LEFT VENTRICLE PLAX 2D LVIDd:         4.00 cm   Diastology LVIDs:         2.30 cm   LV e'  medial:    14.60 cm/s LV PW:         0.80 cm   LV E/e' medial:  4.6 LV IVS:        0.80 cm   LV e' lateral:   13.20 cm/s LVOT diam:     1.90 cm   LV E/e' lateral: 5.1 LV SV:         60 LV SV Index:   41 LVOT Area:     2.84 cm  RIGHT VENTRICLE         IVC TAPSE (  M-mode): 2.1 cm  IVC diam: 1.60 cm LEFT ATRIUM           Index        RIGHT ATRIUM           Index LA diam:      2.30 cm 1.58 cm/m   RA Area:     12.40 cm LA Vol (A4C): 31.9 ml 21.89 ml/m  RA Volume:   31.70 ml  21.75 ml/m  AORTIC VALVE LVOT Vmax:   96.00 cm/s LVOT Vmean:  63.900 cm/s LVOT VTI:    0.213 m  AORTA Ao Root diam: 3.10 cm Ao Asc diam:  3.10 cm MITRAL VALVE MV Area (PHT): 2.99 cm    SHUNTS MV Decel Time: 254 msec    Systemic VTI:  0.21 m MV E velocity: 66.80 cm/s  Systemic Diam: 1.90 cm MV A velocity: 35.10 cm/s MV E/A ratio:  1.90 Epifanio Lesches MD Electronically signed by Epifanio Lesches MD Signature Date/Time: 05/05/2023/1:24:19 PM    Final    CT MAXILLOFACIAL W CONTRAST  Result Date: 05/05/2023 CLINICAL DATA:  Provided history: Dental abscess. EXAM: CT MAXILLOFACIAL WITH CONTRAST TECHNIQUE: Multidetector CT imaging of the maxillofacial structures was performed with intravenous contrast. Multiplanar CT image reconstructions were also generated. RADIATION DOSE REDUCTION: This exam was performed according to the departmental dose-optimization program which includes automated exposure control, adjustment of the mA and/or kV according to patient size and/or use of iterative reconstruction technique. CONTRAST:  75mL OMNIPAQUE IOHEXOL 350 MG/ML SOLN COMPARISON:  None. FINDINGS: Osseous: Very poor dentition with numerous carious and fractured teeth. Periapical lucency surrounding numerous teeth (some of which is expansile in appearance), compatible with periodontal disease and possible dental abscesses. Periapical lucency is most prominent surrounding the left maxillary lateral incisor, left maxillary canine, left maxillary first  premolar, left maxillary first molar, left maxillary third molar, right maxillary first premolar, right maxillary second molar, right maxillary third molar, left mandibular medial incisor left mandibular second premolar, left mandibular first molar, left mandibular third molar, right mandibular medial incisor, right mandibular molars. Orbits: No orbital mass or acute orbital finding. Sinuses: Mild mucosal thickening within the inferior maxillary sinuses, bilaterally. Mild mucosal thickening within a right ethmoid air cell. Soft tissues: 1.4 x 1.2 cm ovoid low-attenuation subcutaneous lesion along the left aspect of the nose (series 3, image 53). Facial skin thickening and edema bilaterally (right greater than left). Limited intracranial: No evidence of an acute intracranial abnormality within the field of view. IMPRESSION: 1. Very poor dentition with numerous carious and fractured teeth. Periapical lucency surrounding numerous teeth (some which is expansile appearance), compatible periodontal disease and possible dental abscesses, as described. 2. Facial skin thickening and edema bilaterally (right greater than left) suspicious for cellulitis. 3. 1.4 x 1.2 cm ovoid low-attenuation subcutaneous lesion along the left aspect of the nose. Although nonspecific, this could reflect an abscess. Direct examination recommended. 4. Mild mucosal thickening within the inferior maxillary sinuses, bilaterally, potentially reflecting odontogenic sinusitis. 5. Mild right ethmoid sinus mucosal thickening. Electronically Signed   By: Jackey Loge D.O.   On: 05/05/2023 09:08   DG Chest Port 1 View  Result Date: 05/04/2023 CLINICAL DATA:  Chest pain EXAM: PORTABLE CHEST 1 VIEW COMPARISON:  None Available. FINDINGS: The heart size and mediastinal contours are within normal limits. Both lungs are clear. The visualized skeletal structures are unremarkable. IMPRESSION: No active disease. Electronically Signed   By: Larose Hires D.O.    On: 05/04/2023 18:42  ROS:ROS  Blood pressure 105/77, pulse 69, temperature 98.2 F (36.8 C), temperature source Oral, resp. rate 19, height 5\' 6"  (1.676 m), weight 45.9 kg, SpO2 97%.  PHYSICAL EXAM: CONSTITUTIONAL: no distress and alert and oriented x 3 PULMONARY/CHEST WALL: effort normal and no stridor, no stertor, no dysphonia HENT: Head : normocephalic and atraumatic Ears: Right ear:   canal normal, external ear normal and hearing normal Left ear:   canal normal, external ear normal and hearing normal Nose: Approximately 1.5 cm well circumscribed area of palpable fluctuance along the left nasal bridge.  There is no overlying erythema or surrounding edema.  It is nontender to palpation. Mouth/Throat:  Mouth: Extremely poor dentition Throat: oropharynx clear and moist Mucous membranes: normal EYES: conjunctiva normal, EOM normal and PERRL NECK: supple, trachea normal and no thyromegaly or cervical LAD  Studies Reviewed: Maxillofacial CT scan personally reviewed.  Well-circumscribed lesion along the left nasal bridge with central fluid collection.  Extremely poor dentition with numerous carious and fractured teeth as well as periapical lucency.  Bilateral facial skin thickening and edema bilaterally, suspect cellulitis secondary to odontogenic infections.  No cervical adenopathy  Assessment/Plan: EDGEL DEGNAN is a 37 y/o m with history of polysubstance abuse admitted for evaluation of chest pain and concern for acute pericarditis.  ENT consulted to evaluate lesion of left nasal bridge.  On exam, there is an approximately 1.5 cm cystic, well-circumscribed lesion of the left nasal bridge.  There is no evidence of acute infectious process, no overlying erythema, no surrounding edema, no tenderness to palpation; exam is consistent with benign cystic lesion. I counseled patient that further determination of nature of lesion can be done with needle aspiration, however patient declined.  I have  low suspicion that this is contributing to patient's clinical symptoms.  I suspect the cellulitic changes in his face are secondary to his extremely poor dentition, with multiple dental caries, multiple fractured teeth and periapical lucency.  No further intervention from ENT perspective.  Patient can follow-up outpatient with ENT if he is interested in tissue sampling or definitve surgical excision. ENT will sign off at this time.   Thank you for allowing me to participate in the care of this patient. Laren Boom, DO Otolaryngology   05/05/2023, 9:52 PM

## 2023-05-05 NOTE — ED Notes (Signed)
Case number:[id number]

## 2023-05-06 ENCOUNTER — Other Ambulatory Visit (HOSPITAL_COMMUNITY): Payer: Self-pay

## 2023-05-06 DIAGNOSIS — I309 Acute pericarditis, unspecified: Secondary | ICD-10-CM

## 2023-05-06 DIAGNOSIS — E43 Unspecified severe protein-calorie malnutrition: Secondary | ICD-10-CM | POA: Insufficient documentation

## 2023-05-06 DIAGNOSIS — I300 Acute nonspecific idiopathic pericarditis: Secondary | ICD-10-CM | POA: Diagnosis not present

## 2023-05-06 DIAGNOSIS — F17210 Nicotine dependence, cigarettes, uncomplicated: Secondary | ICD-10-CM | POA: Diagnosis not present

## 2023-05-06 DIAGNOSIS — F40231 Fear of injections and transfusions: Secondary | ICD-10-CM | POA: Insufficient documentation

## 2023-05-06 LAB — MRSA CULTURE

## 2023-05-06 MED ORDER — MUPIROCIN 2 % EX OINT
TOPICAL_OINTMENT | Freq: Two times a day (BID) | CUTANEOUS | 0 refills | Status: AC
  Filled 2023-05-06: qty 22, 7d supply, fill #0

## 2023-05-06 MED ORDER — COLCHICINE 0.6 MG PO TABS
0.6000 mg | ORAL_TABLET | Freq: Every day | ORAL | 0 refills | Status: AC
  Filled 2023-05-06: qty 90, 90d supply, fill #0

## 2023-05-06 MED ORDER — AMOXICILLIN-POT CLAVULANATE 875-125 MG PO TABS
1.0000 | ORAL_TABLET | Freq: Two times a day (BID) | ORAL | 0 refills | Status: DC
  Filled 2023-05-06: qty 25, 13d supply, fill #0

## 2023-05-06 MED ORDER — ENSURE ENLIVE PO LIQD
237.0000 mL | Freq: Three times a day (TID) | ORAL | Status: DC
  Administered 2023-05-06: 237 mL via ORAL

## 2023-05-06 MED ORDER — AMOXICILLIN-POT CLAVULANATE 875-125 MG PO TABS: 1.0000 | ORAL_TABLET | Freq: Two times a day (BID) | ORAL | Status: DC

## 2023-05-06 MED ORDER — IBUPROFEN 600 MG PO TABS
600.0000 mg | ORAL_TABLET | Freq: Three times a day (TID) | ORAL | 0 refills | Status: AC
  Filled 2023-05-06: qty 36, 12d supply, fill #0

## 2023-05-06 MED ORDER — ADULT MULTIVITAMIN W/MINERALS CH
1.0000 | ORAL_TABLET | Freq: Every day | ORAL | Status: DC
  Administered 2023-05-06: 1
  Filled 2023-05-06: qty 1

## 2023-05-06 NOTE — Discharge Summary (Addendum)
Name: Darius Kennedy MRN: [id number] DOB: 08-09-1986 37 y.o. PCP: Darius Andrew, NP  Date of Admission: 05/04/2023  5:21 PM Date of Discharge: 05/06/2023 5:28 PM Attending Physician: Dr. Antony Kennedy  Discharge Diagnosis: Principal Problem:   Acute pericarditis Active Problems:   Chest pain   Periodontal disease   Protein-calorie malnutrition, severe   Severe needle phobia    Discharge Medications: Allergies as of 05/06/2023   No Known Allergies      Medication List     STOP taking these medications    acetaminophen 650 MG CR tablet Commonly known as: TYLENOL       TAKE these medications    amoxicillin-clavulanate 875-125 MG tablet Commonly known as: AUGMENTIN Take 1 tablet by mouth 2 (two) times daily with a meal.   colchicine 0.6 MG tablet Take 1 tablet (0.6 mg total) by mouth daily. Start taking on: May 07, 2023   ibuprofen 600 MG tablet Commonly known as: ADVIL Take 1 tablet (600 mg total) by mouth 3 (three) times daily for 12 days.   mupirocin ointment 2 % Commonly known as: BACTROBAN Apply topically 2 (two) times daily.        Disposition and follow-up:   DariusKennedy Darius Kennedy was discharged from [facility] in Good condition.  At the hospital follow up visit please address:  1.  Follow-up:  *Acute pericarditis, diffuse ST elevations -Please ensure adherence to ibuprofen for a total of 14 days and colchicine daily for 3 months  -Please ensure adherence to Augmentin for a total of 14 days  -Encourage patient to avoid strenuous activity for the next two weeks  -Consider cardiology outpatient follow up if chest pain persists   *Periodontal disease  *Possible facial cellulitis  *Left nasal 1.4 x 1.2 cm ovoid low-attenuation subcutaneous lesion  *Mild right ethmoid sinus mucosal thickening  -Please assist patient in referral to oral surgeon -Ensure adherence to augmentin  -Please consider anxiety medication prior to procedures  involving needles  -ENT referral outpatient needed   *Severe malnutrition  -Encourage oral intake -Encourage supplementation with ensure or boost to provide nutrients -Provide education of nutrition importance   *Mental Health, acute psychosis  *Needle Phobia -Please note patient's severe needle phobia -Patient may need anxiety medications prior to procedures involving needles -Encourage patient to follow up with outpatient psychiatry -Please follow up with inpatient psychiatry note    *Marijuana use -Please provide patient with resources for cessation if he is interested    2.  Labs / imaging needed at time of follow-up: CBC with differential, A1c, EKG, referrals to oral surgery, referral to psychiatry  3.  Pending labs/ test needing follow-up: blood culture, HIV antibody, lactic acid, hepatitis panel, sedimentation rate, C-reactive protein, follow up with psychiatry note from inpatient   4.  Medication Changes  STOPPED  -Acetaminophen    ADDED  -Ibuprofen for 14 days total  -Colchicine for 3 months total  -Augmentin 875-125mg  BID for 14 days total  -mupirocin ointment     Follow-up Appointments:  Follow-up Information     [facility] Follow up.   Specialty: Behavioral Health Why: Walk in behavioral health services for medication management and therapy. Open access hours are 9am-1pm.   Outside of the above hours, this facility is open 24 hours a day/7 days a week. Contact information: [address] [place] [address] [phone]        Darius Andrew, NP Follow up on 05/15/2023.  Specialties: Pulmonary Disease, Endocrinology Why: Appointment is at 1:20 be there at 1:00 Contact information: [address] [place] [address] [phone]                 Hospital Course by problem list: Sepsis complicated by Acute Pericarditis Pt initially presented with CP and N/V, had EKG with ST elevations  in multiple leads and PR depressions with leukocytosis with neutrophil predominance. ACS ruled out given negative troponins x2. Cards consulted and after conversation with pt + family, opted not to pursue urgent cath. Suspect chest pain is due to acute pericarditis, which was suspected to be an inflammatory complication of sepsis stemming from severe periodontal disease with possible abscesses seen on CT done 7/22. Blood cultures unfortunatly not collected when rest of blood work was, and pt refused subsequent collection due to needle phobia. Echocardiogram done 7/22 showed LVEF 60-65%, no evidence of pericardial effusion. Pt on colchicine 0.6 mg daily for 3 months and ibuprofen 600 mg TID for 2 weeks as per cardiology recommendations. Pt started on empiric Unasyn, switched to oral Augmentin on 7/23 for pt to take a dose that evening, then complete a 12 day course. Pt will establish with PCP outpatient to help coordinate care; appointment arranged prior to discharge.  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 Previously was going to undergo dental surgery in 10/2022, but pt refused IV and left procedure; this oral surgeon then fired pt from practice. CT Maxillofacial done 7/22 showed very poor dentition with numerous carious and fractured teeth; periapical lucency surrounding numerous teeth (some which is expansile appearance), compatible periodontal disease and possible dental abscesses. Possible abscesses are a likely nidus for pericarditis. Pt strongly encouraged to follow-up with a different oral surgeon at discharge; PCP can help with this if pt and family unable to find a practice.     Prediabetes Poor nutrition UA collected on admission abnormal with protein 30 and glucose >500; also initially with mild anion gap metabolic acidosis, since resolved. No hx of diabetes. Ha1c 5.7% with B-hydroxybutyric acid of 0.06 on 7/22 meaning very low likelihood that  initial vomiting related to DKA, no concern at this time. At baseline, pt has poor po intake and frequently skips meals; unsure if related to poor dentition. Ensure BID supplementation. Nutrition consulted, who recommended pt supplement with Ensure TID to ensure adequate caloric intake.   Anxiety Severe Needle Phobia Initially concerns for paranoia/psychosis on patient presentation and was made IVC given AMS/Psychosis. He also had increased levels of anxiety in a hospital setting, especially regarding needles; this might have been driving initial presentation. Pt improved after receiving clonazepam, Seroquel at bedtime. Will ensure this phobia is in pt's chart so that providers are aware of this moving forward. Pt examined by psych on 7/23, who agreed with plans to discharge pt and have him follow-up outpatient; pt provided list of psychiatric resources to follow-up with outpatient at discharge.  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 Denies current/recent use. Does report recent use of marijuana, consistent with UDS which was positive for THC only. Pt also reports longstanding history of tobacco use with smoking cigarettes, given nicotine patch while in hospital.    Discharge Subjective:  Today, he reported feeling better. His chest pain has completely resolved. He denied shortness of breath or any complaints. He is requesting to go home and agreed  to the discharge plan.   Discharge Exam:   Blood pressure 113/88, pulse 71, temperature 97.8 F (36.6 C), temperature source Oral, resp. rate 15, height 5\' 6"  (1.676 m), weight 45.9 kg, SpO2 98%.  Constitutional:non ill-appearing, thin, comfortably sitting in bed, in no acute distress HENT: poor dentition, mass on left proximal nose Cardiovascular: regular rate and rhythm, no m/r/g Pulmonary/Chest: normal work of breathing on room air, lungs  clear to auscultation bilaterally. No crackles  Neurological: alert & oriented x 3 Skin: warm and dry Psych: Normal mood and affect  Pertinent Labs, Studies, and Procedures:     Latest Ref Rng & Units 05/05/2023    3:23 AM 05/04/2023    6:43 PM  CBC  WBC 4.0 - 10.5 K/uL 18.1  27.9   Hemoglobin 13.0 - 17.0 g/dL 16.1  09.6   Hematocrit 39.0 - 52.0 % 43.3  48.1   Platelets 150 - 400 K/uL 346  405        Latest Ref Rng & Units 05/05/2023    3:23 AM 05/04/2023    6:43 PM  CMP  Glucose 70 - 99 mg/dL 045  409   BUN 6 - 20 mg/dL 12  8   Creatinine 8.11 - 1.24 mg/dL 9.14  7.82   Sodium 956 - 145 mmol/L 135  137   Potassium 3.5 - 5.1 mmol/L 4.2  3.8   Chloride 98 - 111 mmol/L 98  100   CO2 22 - 32 mmol/L 22  19   Calcium 8.9 - 10.3 mg/dL 9.8  21.3   Total Protein 6.5 - 8.1 g/dL 7.1  8.6   Total Bilirubin 0.3 - 1.2 mg/dL 0.7  0.8   Alkaline Phos 38 - 126 U/L 106  127   AST 15 - 41 U/L 26  27   ALT 0 - 44 U/L 44  54     ECHOCARDIOGRAM COMPLETE  Result Date: 05/05/2023    ECHOCARDIOGRAM REPORT   Patient Name:   HERNY SCURLOCK [facility] Date of Exam: 05/05/2023 Medical Rec #:  [id number]       Height:       64.0 in Accession #:    [id number]      Weight:       100.0 lb Date of Birth:  1985-11-06       BSA:          1.457 m Patient Age:    36 years        BP:           106/67 mmHg Patient Gender: M               HR:           83 bpm. Exam Location:  Inpatient Procedure: 2D Echo, Cardiac Doppler and Color Doppler Indications:    R07.9* Chest pain, unspecified  History:        Patient has no prior history of Echocardiogram examinations.  Sonographer:    Harriette Bouillon RDCS Referring Phys: [id number] CAROLYN GUILLOUD IMPRESSIONS  1. Left ventricular ejection fraction, by estimation, is 60 to 65%. The left ventricle has normal function. The left ventricle has no regional wall motion abnormalities. Left ventricular diastolic parameters were normal.  2. Right ventricular systolic function is normal. The right  ventricular size is normal. Tricuspid regurgitation signal is inadequate for assessing PA pressure.  3. The mitral valve is normal in structure. No evidence of mitral valve regurgitation.  4. The aortic valve was not well  visualized. Aortic valve regurgitation is not visualized. No aortic stenosis is present.  5. The inferior vena cava is normal in size with greater than 50% respiratory variability, suggesting right atrial pressure of 3 mmHg. FINDINGS  Left Ventricle: Left ventricular ejection fraction, by estimation, is 60 to 65%. The left ventricle has normal function. The left ventricle has no regional wall motion abnormalities. The left ventricular internal cavity size was normal in size. There is  no left ventricular hypertrophy. Left ventricular diastolic parameters were normal. Right Ventricle: The right ventricular size is normal. No increase in right ventricular wall thickness. Right ventricular systolic function is normal. Tricuspid regurgitation signal is inadequate for assessing PA pressure. Left Atrium: Left atrial size was normal in size. Right Atrium: Right atrial size was normal in size. Pericardium: There is no evidence of pericardial effusion. Mitral Valve: The mitral valve is normal in structure. No evidence of mitral valve regurgitation. Tricuspid Valve: The tricuspid valve is normal in structure. Tricuspid valve regurgitation is not demonstrated. Aortic Valve: The aortic valve was not well visualized. Aortic valve regurgitation is not visualized. No aortic stenosis is present. Pulmonic Valve: The pulmonic valve was not well visualized. Pulmonic valve regurgitation is not visualized. Aorta: The aortic root is normal in size and structure. Venous: The inferior vena cava is normal in size with greater than 50% respiratory variability, suggesting right atrial pressure of 3 mmHg. IAS/Shunts: The interatrial septum was not well visualized.  LEFT VENTRICLE PLAX 2D LVIDd:         4.00 cm   Diastology  LVIDs:         2.30 cm   LV e' medial:    14.60 cm/s LV PW:         0.80 cm   LV E/e' medial:  4.6 LV IVS:        0.80 cm   LV e' lateral:   13.20 cm/s LVOT diam:     1.90 cm   LV E/e' lateral: 5.1 LV SV:         60 LV SV Index:   41 LVOT Area:     2.84 cm  RIGHT VENTRICLE         IVC TAPSE (M-mode): 2.1 cm  IVC diam: 1.60 cm LEFT ATRIUM           Index        RIGHT ATRIUM           Index LA diam:      2.30 cm 1.58 cm/m   RA Area:     12.40 cm LA Vol (A4C): 31.9 ml 21.89 ml/m  RA Volume:   31.70 ml  21.75 ml/m  AORTIC VALVE LVOT Vmax:   96.00 cm/s LVOT Vmean:  63.900 cm/s LVOT VTI:    0.213 m  AORTA Ao Root diam: 3.10 cm Ao Asc diam:  3.10 cm MITRAL VALVE MV Area (PHT): 2.99 cm    SHUNTS MV Decel Time: 254 msec    Systemic VTI:  0.21 m MV E velocity: 66.80 cm/s  Systemic Diam: 1.90 cm MV A velocity: 35.10 cm/s MV E/A ratio:  1.90 Epifanio Lesches MD Electronically signed by Epifanio Lesches MD Signature Date/Time: 05/05/2023/1:24:19 PM    Final    CT MAXILLOFACIAL W CONTRAST  Result Date: 05/05/2023 CLINICAL DATA:  Provided history: Dental abscess. EXAM: CT MAXILLOFACIAL WITH CONTRAST TECHNIQUE: Multidetector CT imaging of the maxillofacial structures was performed with intravenous contrast. Multiplanar CT image reconstructions were also generated. RADIATION DOSE REDUCTION:  This exam was performed according to the departmental dose-optimization program which includes automated exposure control, adjustment of the mA and/or kV according to patient size and/or use of iterative reconstruction technique. CONTRAST:  75mL OMNIPAQUE IOHEXOL 350 MG/ML SOLN COMPARISON:  None. FINDINGS: Osseous: Very poor dentition with numerous carious and fractured teeth. Periapical lucency surrounding numerous teeth (some of which is expansile in appearance), compatible with periodontal disease and possible dental abscesses. Periapical lucency is most prominent surrounding the left maxillary lateral incisor, left maxillary  canine, left maxillary first premolar, left maxillary first molar, left maxillary third molar, right maxillary first premolar, right maxillary second molar, right maxillary third molar, left mandibular medial incisor left mandibular second premolar, left mandibular first molar, left mandibular third molar, right mandibular medial incisor, right mandibular molars. Orbits: No orbital mass or acute orbital finding. Sinuses: Mild mucosal thickening within the inferior maxillary sinuses, bilaterally. Mild mucosal thickening within a right ethmoid air cell. Soft tissues: 1.4 x 1.2 cm ovoid low-attenuation subcutaneous lesion along the left aspect of the nose (series 3, image 53). Facial skin thickening and edema bilaterally (right greater than left). Limited intracranial: No evidence of an acute intracranial abnormality within the field of view. IMPRESSION: 1. Very poor dentition with numerous carious and fractured teeth. Periapical lucency surrounding numerous teeth (some which is expansile appearance), compatible periodontal disease and possible dental abscesses, as described. 2. Facial skin thickening and edema bilaterally (right greater than left) suspicious for cellulitis. 3. 1.4 x 1.2 cm ovoid low-attenuation subcutaneous lesion along the left aspect of the nose. Although nonspecific, this could reflect an abscess. Direct examination recommended. 4. Mild mucosal thickening within the inferior maxillary sinuses, bilaterally, potentially reflecting odontogenic sinusitis. 5. Mild right ethmoid sinus mucosal thickening. Electronically Signed   By: Jackey Loge D.O.   On: 05/05/2023 09:08   DG Chest Port 1 View  Result Date: 05/04/2023 CLINICAL DATA:  Chest pain EXAM: PORTABLE CHEST 1 VIEW COMPARISON:  None Available. FINDINGS: The heart size and mediastinal contours are within normal limits. Both lungs are clear. The visualized skeletal structures are unremarkable. IMPRESSION: No active disease. Electronically  Signed   By: Larose Hires D.O.   On: 05/04/2023 18:42     Discharge Instructions: Discharge Instructions     Call MD for:  difficulty breathing, headache or visual disturbances   Complete by: As directed    Call MD for:  extreme fatigue   Complete by: As directed    Call MD for:  hives   Complete by: As directed    Call MD for:  persistant dizziness or light-headedness   Complete by: As directed    Call MD for:  persistant nausea and vomiting   Complete by: As directed    Call MD for:  redness, tenderness, or signs of infection (pain, swelling, redness, odor or green/yellow discharge around incision site)   Complete by: As directed    Call MD for:  severe uncontrolled pain   Complete by: As directed    Call MD for:  temperature >100.4   Complete by: As directed    Diet - low sodium heart healthy   Complete by: As directed    Discharge instructions   Complete by: As directed    Mr. Main,  It was a pleasure caring for you during your hospitalization! You presented with chest pain, which we believe was due to pericarditis, or inflammation of the protective sack (pericardium) around your heart. We think this inflammation was related to an  infection of your mouth and teeth, which you will need oral surgery to correct. Without oral surgery, you could continue to get inflammation, or even infection, of your heart.  There are a few things for you to do once you leave the hospital: 1. Follow up with a Primary Care Physician (PCP); we have made an appointment for you with Angus Seller at [facility] ([address] [place], [place], [address]) for Thurs 05/15/23 at 1:20pm. Please notify your family doctor about your needle phobia for future surgeries/ procedures.  2. Find another oral surgeon to undergo surgery for your teeth; please let their office know of your significant needle phobia so you can receive anxiety treatment prior to any procedures involving  needles. 3. Continue to take Augmentin 1 tablet two times a day with meals, which will help with the infection in your mouth. You will take this tonight, and then for an additional 12 days (last dose on Sun 05/18/23). 4. Take ibuprofen 600 mg three times a day for two weeks to help the inflammation in your heart. 5. Take colchicine 0.6 mg once a day for three months to help the inflammation in your heart. 6. Drink Ensure three times a day in between meals to help provide the calories you need to fight off infection and heal from inflammation; you can get these over the counter. 7. Follow-up with a psychologist/psychiatrist outpatient for help managing your anxiety and severe needle phobia; resources are included below.  If you experience any of the following symptoms, please seek care at an emergency department: -Chest pain -Difficulty breathing -Fever/chills -Palpations -Syncope (blacking out)   We are so glad you are feeling better! Please call your primary care physician or, if you can't contact them, return to the hospital if you begin to experience significant chest pain again.  Thank you for letting us care for you! Governor Rooks, Medical Student Dr. Faith Rogue, PGY1 Dr. Morene Crocker, PGY2   Psychology Resources:  [facility] Follow up. Why: A referral for psychiatric medication management and therapy has been made and is currently in review. Please call: [phone] with any questions or to check the status of this referral.  Contact information: [address] [place] [address] [phone]  Additional option(s) for medication management/therapy:  [facility] (offers medication management and therapy) Why: Medication Management If interested, please walk in between 8am-10am Monday-Friday to be scheduled for an assessment and to initiate services. Contact information: [address] [place]  [place] [address] [phone]  [facility] (offers medication management and therapy) [address] [place] [address] Tel: [phone] or [phone] Fax: [phone]  Crisis Resources 988-National Suicide Prevention and Crisis Hotline National Suicide Prevention Lifeline 434-419-2977, [place] [employer] Teen Line (501) 440-2942 National Alliance on Mental Illness (NAMI) 800-950-NAMI Crisis Text Line Text (954)213-0300  [facility] Mobile Crisis Management - 24 hour crisis hotline: 2545422014 The service operates year round, seven days per week, twenty four hours per day. The program is staffed with masters prepared staff that are licensed, licensed eligible therapists, or bachelor's level Qualified Professionals. Mobile Crisis Management serves anyone within the community experiencing a crisis related to mental health, substance abuse, or developmental disability concerns. The staff in this program has the capability to make appropriate clinical decisions in order to determine an appropriate course of action and stabilize a crisis situation as quickly as possible.  [facility]) [address]  [place] [id number]; opt 2 This unit is staffed 24-hours-a-day by nurses, licensed clinicians, and certified peer support specialist. Services are provided to all persons regardless of age, sex, race, nationality, sexual orientation or ability to pay. Persons under the age of 18 may receive an assessment and behavioral health care.   Increase activity slowly   Complete by: As directed        Signed: Faith Rogue DO [facility] - PGY1 Pager: [phone] 05/06/2023, 5:28 PM    Please contact the on call pager after 5 pm and on weekends at [phone].

## 2023-05-06 NOTE — Discharge Instructions (Signed)
  [facility] Follow up. Why: A referral for psychiatric medication management and therapy has been made and is currently in review. Please call: [phone] with any questions or to check the status of this referral.  Contact information: [address] [place] [address] [phone]  Additional option(s) for medication management/therapy:  [facility] (offers medication management and therapy) Why: Medication Management If interested, please walk in between 8am-10am Monday-Friday to be scheduled for an assessment and to initiate services. Contact information: [address] [place] [address] [phone]  [facility] (offers medication management and therapy) [address] [place] [address] Tel: [phone] or [phone] Fax: [phone]  Crisis Resources 988-National Suicide Prevention and Crisis Hotline National Suicide Prevention Lifeline 716 753 6673, [place] [employer] Teen Line 423-639-8421 National Alliance on Mental Illness (NAMI) 800-950-NAMI Crisis Text Line Text (339)025-4963  [facility] Mobile Crisis Management - 24 hour crisis hotline: 229-480-9330 The service operates year round, seven days per week, twenty four hours per day. The program is staffed with masters prepared staff that are licensed, licensed eligible therapists, or bachelor's level Qualified Professionals. Mobile Crisis Management serves anyone within the community experiencing a crisis related to mental health, substance abuse, or developmental disability concerns. The staff in this program has the capability to make appropriate clinical decisions in order to determine an appropriate course of action and stabilize a crisis situation as quickly as possible.  [facility]) [address]  [place] [phone]; opt 2 This  unit is staffed 24-hours-a-day by nurses, licensed clinicians, and certified peer support specialist. Services are provided to all persons regardless of age, sex, race, nationality, sexual orientation or ability to pay. Persons under the age of 18 may receive an assessment and behavioral health care.

## 2023-05-06 NOTE — Progress Notes (Signed)
Initial Nutrition Assessment  DOCUMENTATION CODES:   Severe malnutrition in context of social or environmental circumstances, Underweight  INTERVENTION:   - Increase Ensure Enlive po to TID, each supplement provides 350 kcal and 20 grams of protein  - Add Magic Cup TID with meals, each supplement provides 290 kcal and 9 grams of protein  - "High Calorie, High Protein Nutrition Therapy" handout and diet education provided to pt and pt's mother  - Add MVI with minerals daily  NUTRITION DIAGNOSIS:   Severe Malnutrition related to social / environmental circumstances (polysubstance abuse) as evidenced by severe fat depletion, severe muscle depletion, percent weight loss (10.5% weight loss x 6 months).  GOAL:   Patient will meet greater than or equal to 90% of their needs  MONITOR:   PO intake, Supplement acceptance, Weight trends  REASON FOR ASSESSMENT:   Consult Assessment of nutrition requirement/status  ASSESSMENT:   37 year old male who presented to the ED on 7/21 with chest pain, SOB, and N/V. PMH of polysubstance abuse, ADHD. Pt admitted with acute pericarditis and sepsis secondary to odontogenic infection.  RD consulted for assessment. Discussed pt with attending team prior to visiting with pt. Attending team expresses concern that pt's poor dentition may be impacting pt's poor PO intake at baseline.  Per ENT note, pt with extremely poor dentition including numerous carious and fractured teeth as well as periapical lucency. Pt found to have well-circumscribed lesion along the left nasal bridge with central fluid collection. ENT recommending outpatient follow-up if pt is interested in tissue sampling or definitive surgical excision.  Spoke with pt and pt's mother at bedside. Pt lives with his parents. Pt appeared anxious at time of RD visit and perseverated on needing to take a shower. Pt and mother report that pt eats between 0-2 meals daily depending on the day. Pt ate  some eggs for breakfast this morning but usually skips breakfast at home and just drinks coffee. If pt does not skip lunch, he may eat a hamburger and fries from fast food restaurant. If pt does not skip dinner, he eats whatever his mother fixes which may include pork chops or chicken legs with vegetables. Some days pt skips all meals and just drinks several pots of coffee throughout the day.  Pt endorses weight loss over time. He states that he used to weigh 110 lbs but now weighs 101 lbs; pt unsure of timeframe of this weight loss. Pt's mother shares that pt has had an even more significant weight loss over the last 4 years. Reviewed weight history in chart. Pt with a 5.4 kg weight loss since 11/01/22. This is a 10.5% weight loss in 6 months which is severe and significant for timeframe. Based on weight loss and NFPE, pt meets criteria for severe malnutrition.  Pt reports that he has not yet received an Ensure supplement. Pt is willing to drink them, so RD provided a chocolate Ensure at time of visit. Encouraged pt and mother to pick up some oral nutrition supplements (store brand is fine) for use at home. Encouraged pt to alternate between cups of coffee and oral nutrition supplements and to consume a supplement if he skips a meal. Pt expresses understanding.  Pt denies any issues chewing or swallowing regular textures despite poor dentition. Pt denies pain with chewing and declines RD offer to downgrade diet to dysphagia 3 (mechanical soft).  RD provided "High-Calorie High-Protein Nutrition Therapy" handout from the Academy of [employer]. Provided examples on ways to increase  caloric density of foods and beverages frequently consumed by the patient. Also provided ideas to promote variety and to incorporate additional nutrient dense foods into patient's diet. Discussed eating small frequent meals and snacks to assist in increasing overall po intake. Teach back method used.  Medications  reviewed and include: Ensure Enlive BID, IV abx  Labs reviewed: hemoglobin A1C 5.7  NUTRITION - FOCUSED PHYSICAL EXAM:  Flowsheet Row Most Recent Value  Orbital Region Severe depletion  Upper Arm Region Severe depletion  Thoracic and Lumbar Region Severe depletion  Buccal Region Severe depletion  Temple Region Severe depletion  Clavicle Bone Region Severe depletion  Clavicle and Acromion Bone Region Severe depletion  Scapular Bone Region Severe depletion  Dorsal Hand Severe depletion  Patellar Region Unable to assess  Anterior Thigh Region Unable to assess  Posterior Calf Region Unable to assess  Edema (RD Assessment) None  Hair Reviewed  Eyes Reviewed  Mouth Reviewed  [poor dentition]  Skin Reviewed  Nails Reviewed    Diet Order:   Diet Order             Diet regular Room service appropriate? Yes; Fluid consistency: Thin  Diet effective now                   EDUCATION NEEDS:   Education needs have been addressed  Skin:  Skin Assessment: Reviewed RN Assessment (abscess to L side of nose)  Last BM:  05/05/23  Height:   Ht Readings from Last 1 Encounters:  05/05/23 5\' 6"  (1.676 m)    Weight:   Wt Readings from Last 1 Encounters:  05/05/23 45.9 kg    Ideal Body Weight:  64.5 kg  BMI:  Body mass index is 16.35 kg/m.  Estimated Nutritional Needs:   Kcal:  1800-2000  Protein:  80-95 grams  Fluid:  >1.8 L    Mertie Clause, MS, RD, LDN Inpatient Clinical Dietitian Please see AMiON for contact information.

## 2023-05-06 NOTE — TOC Transition Note (Signed)
Transition of Care [facility]) - CM/SW Discharge Note   Patient Details  Name: Darius Kennedy MRN: [id number] Date of Birth: [birth year]  Transition of Care [facility]) CM/SW Contact:  Harriet Masson, RN Phone Number: 05/06/2023, 1:58 PM   Clinical Narrative:     Patient stable for discharge.  Patient is agreeable for TOC to make PCP apt. CMA added apt to AVS. Patient's parents can transport patient to apts.  No other TOC needs at this time.    Final next level of care: Home/Self Care Barriers to Discharge: Barriers Resolved   Patient Goals and CMS Choice  Return home    Discharge Placement    home                     Discharge Plan and Services Additional resources added to the After Visit Summary for   In-house Referral: PCP / [facility] Discharge Planning Services: Follow-up appt scheduled                                 Social Determinants of Health (SDOH) Interventions SDOH Screenings   Depression (PHQ2-9): High Risk (06/18/2022)  Tobacco Use: High Risk (05/04/2023)     Readmission Risk Interventions    05/06/2023    1:58 PM  Readmission Risk Prevention Plan  Post Dischage Appt Complete  Medication Screening Complete  Transportation Screening Complete

## 2023-05-06 NOTE — Progress Notes (Signed)
   Patient Name: Darius Kennedy Date of Encounter: 05/06/2023 [facility] Cardiologist: None   Interval Summary  .    Getting a lab holiday today.  Had paranoia yesterday.  Standing up in room.  Still having some trouble eating because of anxiety.  Family members in room.  Sitter in room.  Denies any current chest pain or shortness of breath.  Vital Signs .    Vitals:   05/06/23 0000 05/06/23 0402 05/06/23 0720 05/06/23 0824  BP: 114/77 91/64 113/88   Pulse: 64 77 82 71  Resp: 20 20 16 15   Temp: 98.2 F (36.8 C) 98.4 F (36.9 C)  98.1 F (36.7 C)  TempSrc: Oral Oral  Oral  SpO2:  97% 98% 98%  Weight:      Height:        Intake/Output Summary (Last 24 hours) at 05/06/2023 0904 Last data filed at 05/06/2023 0800 Gross per 24 hour  Intake 358 ml  Output --  Net 358 ml      05/05/2023    6:17 PM 05/04/2023    5:13 PM 11/01/2022    8:28 AM  Last 3 Weights  Weight (lbs) 101 lb 4.8 oz 100 lb 113 lb  Weight (kg) 45.949 kg 45.36 kg 51.256 kg      Telemetry/ECG    No adverse arrhythmias.- Personally Reviewed  Physical Exam .   GEN: No acute distress.  Thin. Neck: No JVD Cardiac: RRR, no murmurs, rubs, or gallops.  Respiratory: Clear to auscultation bilaterally. GI: Soft, nontender, non-distended  MS: No edema  Assessment & Plan .     Acute pericarditis - Reflected by diffuse ST segment elevation on EKG. - Treatment is ibuprofen 600 mg 3 times a day for total of 14 days - Also colchicine 0.6 mg daily for 3 months to help prevent recurrence.  Watch for any signs of diarrhea as a potential side effect. -Troponin normal, no signs of myopericarditis. -White blood cell count 18.1.  ENT consultation reassuring.  No active signs of infection. -Echocardiogram normal pump function with no pericardial effusion.  Anorexia, paranoia, history of polysubstance abuse - Denies any recent use of methamphetamines.  Does report THC.  Did not partake in IV drug use due to  fear of needles.  We will sign off.  Please let us know if we can be of further assistance.  He may continue to follow-up with PCP. Good overall prognosis.  Try to avoid heavy exertional activity for the next 2 weeks.  Please let us know if anything changes.  For questions or updates, please contact [facility] Please consult [email or web address] for contact info under        Signed, Donato Schultz, MD

## 2023-05-06 NOTE — Progress Notes (Signed)
CSW has uploaded the change of commitment form to ecourt and successfully rescinded the IVC.   Oletta Lamas, MSW, LCSWA, LCASA Transitions of Care  Clinical Social Worker I

## 2023-05-07 ENCOUNTER — Telehealth: Payer: Self-pay | Admitting: Nurse Practitioner

## 2023-05-07 NOTE — Telephone Encounter (Signed)
Copied from CRM [id number]. Topic: Referral - Request for Referral >> May 07, 2023  9:06 AM Franchot Heidelberg wrote: Has patient seen PCP for this complaint? Yes.   *If NO, is insurance requiring patient see PCP for this issue before PCP can refer them? Referral for which specialty: Oral surgery  Preferred provider/office: Highest recommended but not with Dr. Benson Norway  Reason for referral: Oral issues contributing to heart issues was just seen in the ED

## 2023-05-15 ENCOUNTER — Ambulatory Visit (INDEPENDENT_AMBULATORY_CARE_PROVIDER_SITE_OTHER): Payer: Medicaid Other | Admitting: Nurse Practitioner

## 2023-05-15 ENCOUNTER — Encounter: Payer: Self-pay | Admitting: Nurse Practitioner

## 2023-05-15 VITALS — BP 126/75 | HR 72 | Wt 103.8 lb

## 2023-05-15 DIAGNOSIS — F40298 Other specified phobia: Secondary | ICD-10-CM | POA: Diagnosis not present

## 2023-05-15 DIAGNOSIS — K047 Periapical abscess without sinus: Secondary | ICD-10-CM

## 2023-05-15 DIAGNOSIS — I309 Acute pericarditis, unspecified: Secondary | ICD-10-CM | POA: Diagnosis not present

## 2023-05-15 NOTE — Progress Notes (Signed)
@Patient  ID: Darius Kennedy, male    DOB: 12-Oct-1986, 37 y.o.   MRN: [id number]  Chief Complaint  Patient presents with   Follow-up    Recent hospital stay    Referring provider: No ref. provider found   HPI  Patient presents today to establish care.  Patient was just discharged from the hospital.  He was found to have myocarditis due to dental abscesses.  He has not been compliant with medications since hospital discharge.  He only takes his medications once per day.  We discussed the importance of taking medications as directed.  Patient does need referral back to cardiology and to oral surgeon.  Placed referrals today. Denies f/c/s, n/v/d, hemoptysis, PND, leg swelling Denies chest pain or edema       No Known Allergies   There is no immunization history on file for this patient.  Past Medical History:  Diagnosis Date   ADHD (attention deficit hyperactivity disorder)    Drug abuse (HCC)    Seasonal allergies     Tobacco History: Social History   Tobacco Use  Smoking Status Every Day   Current packs/day: 1.00   Types: Cigarettes  Smokeless Tobacco Never   Ready to quit: Not Answered Counseling given: Not Answered   Outpatient Encounter Medications as of 05/15/2023  Medication Sig   amoxicillin-clavulanate (AUGMENTIN) 875-125 MG tablet Take 1 tablet by mouth 2 (two) times daily with a meal.   colchicine 0.6 MG tablet Take 1 tablet (0.6 mg total) by mouth daily.   ibuprofen (ADVIL) 600 MG tablet Take 1 tablet (600 mg total) by mouth 3 (three) times daily for 12 days.   mupirocin ointment (BACTROBAN) 2 % Apply topically 2 (two) times daily.   No facility-administered encounter medications on file as of 05/15/2023.     Review of Systems  Review of Systems  Constitutional: Negative.   HENT: Negative.    Cardiovascular: Negative.   Gastrointestinal: Negative.   Allergic/Immunologic: Negative.   Neurological: Negative.   Psychiatric/Behavioral: Negative.          Physical Exam  BP 126/75   Pulse 72   Wt 103 lb 12.8 oz (47.1 kg)   SpO2 98%   BMI 16.75 kg/m   Wt Readings from Last 5 Encounters:  05/15/23 103 lb 12.8 oz (47.1 kg)  05/05/23 101 lb 4.8 oz (45.9 kg)  11/01/22 113 lb (51.3 kg)  06/18/22 103 lb 12.8 oz (47.1 kg)  02/06/21 123 lb (55.8 kg)     Physical Exam Vitals and nursing note reviewed.  Constitutional:      General: He is not in acute distress.    Appearance: He is well-developed.  Cardiovascular:     Rate and Rhythm: Normal rate and regular rhythm.  Pulmonary:     Effort: Pulmonary effort is normal.     Breath sounds: Normal breath sounds.  Skin:    General: Skin is warm and dry.  Neurological:     Mental Status: He is alert and oriented to person, place, and time.      Lab Results:  CBC    Component Value Date/Time   WBC 18.1 (H) 05/05/2023 0323   RBC 5.12 05/05/2023 0323   HGB 14.9 05/05/2023 0323   HCT 43.3 05/05/2023 0323   PLT 346 05/05/2023 0323   MCV 84.6 05/05/2023 0323   MCH 29.1 05/05/2023 0323   MCHC 34.4 05/05/2023 0323   RDW 13.5 05/05/2023 0323   LYMPHSABS 2.5 05/04/2023 1843  MONOABS 2.0 (H) 05/04/2023 1843   EOSABS 0.0 05/04/2023 1843   BASOSABS 0.1 05/04/2023 1843    BMET    Component Value Date/Time   NA 135 05/05/2023 0323   K 4.2 05/05/2023 0323   CL 98 05/05/2023 0323   CO2 22 05/05/2023 0323   GLUCOSE 139 (H) 05/05/2023 0323   BUN 12 05/05/2023 0323   CREATININE 0.94 05/05/2023 0323   CALCIUM 9.8 05/05/2023 0323   GFRNONAA >60 05/05/2023 0323    BNP No results found for: "BNP"  ProBNP No results found for: "PROBNP"  Imaging: ECHOCARDIOGRAM COMPLETE  Result Date: 05/05/2023    ECHOCARDIOGRAM REPORT   Patient Name:   Darius Kennedy [facility] Date of Exam: 05/05/2023 Medical Rec #:  [id number]       Height:       64.0 in Accession #:    [id number]      Weight:       100.0 lb Date of Birth:  1986-10-01       BSA:          1.457 m Patient Age:    36 years        BP:            106/67 mmHg Patient Gender: M               HR:           83 bpm. Exam Location:  Inpatient Procedure: 2D Echo, Cardiac Doppler and Color Doppler Indications:    R07.9* Chest pain, unspecified  History:        Patient has no prior history of Echocardiogram examinations.  Sonographer:    Harriette Bouillon RDCS Referring Phys: [id number] CAROLYN GUILLOUD IMPRESSIONS  1. Left ventricular ejection fraction, by estimation, is 60 to 65%. The left ventricle has normal function. The left ventricle has no regional wall motion abnormalities. Left ventricular diastolic parameters were normal.  2. Right ventricular systolic function is normal. The right ventricular size is normal. Tricuspid regurgitation signal is inadequate for assessing PA pressure.  3. The mitral valve is normal in structure. No evidence of mitral valve regurgitation.  4. The aortic valve was not well visualized. Aortic valve regurgitation is not visualized. No aortic stenosis is present.  5. The inferior vena cava is normal in size with greater than 50% respiratory variability, suggesting right atrial pressure of 3 mmHg. FINDINGS  Left Ventricle: Left ventricular ejection fraction, by estimation, is 60 to 65%. The left ventricle has normal function. The left ventricle has no regional wall motion abnormalities. The left ventricular internal cavity size was normal in size. There is  no left ventricular hypertrophy. Left ventricular diastolic parameters were normal. Right Ventricle: The right ventricular size is normal. No increase in right ventricular wall thickness. Right ventricular systolic function is normal. Tricuspid regurgitation signal is inadequate for assessing PA pressure. Left Atrium: Left atrial size was normal in size. Right Atrium: Right atrial size was normal in size. Pericardium: There is no evidence of pericardial effusion. Mitral Valve: The mitral valve is normal in structure. No evidence of mitral valve regurgitation. Tricuspid Valve:  The tricuspid valve is normal in structure. Tricuspid valve regurgitation is not demonstrated. Aortic Valve: The aortic valve was not well visualized. Aortic valve regurgitation is not visualized. No aortic stenosis is present. Pulmonic Valve: The pulmonic valve was not well visualized. Pulmonic valve regurgitation is not visualized. Aorta: The aortic root is normal in size and structure. Venous: The inferior vena cava is  normal in size with greater than 50% respiratory variability, suggesting right atrial pressure of 3 mmHg. IAS/Shunts: The interatrial septum was not well visualized.  LEFT VENTRICLE PLAX 2D LVIDd:         4.00 cm   Diastology LVIDs:         2.30 cm   LV e' medial:    14.60 cm/s LV PW:         0.80 cm   LV E/e' medial:  4.6 LV IVS:        0.80 cm   LV e' lateral:   13.20 cm/s LVOT diam:     1.90 cm   LV E/e' lateral: 5.1 LV SV:         60 LV SV Index:   41 LVOT Area:     2.84 cm  RIGHT VENTRICLE         IVC TAPSE (M-mode): 2.1 cm  IVC diam: 1.60 cm LEFT ATRIUM           Index        RIGHT ATRIUM           Index LA diam:      2.30 cm 1.58 cm/m   RA Area:     12.40 cm LA Vol (A4C): 31.9 ml 21.89 ml/m  RA Volume:   31.70 ml  21.75 ml/m  AORTIC VALVE LVOT Vmax:   96.00 cm/s LVOT Vmean:  63.900 cm/s LVOT VTI:    0.213 m  AORTA Ao Root diam: 3.10 cm Ao Asc diam:  3.10 cm MITRAL VALVE MV Area (PHT): 2.99 cm    SHUNTS MV Decel Time: 254 msec    Systemic VTI:  0.21 m MV E velocity: 66.80 cm/s  Systemic Diam: 1.90 cm MV A velocity: 35.10 cm/s MV E/A ratio:  1.90 Epifanio Lesches MD Electronically signed by Epifanio Lesches MD Signature Date/Time: 05/05/2023/1:24:19 PM    Final    CT MAXILLOFACIAL W CONTRAST  Result Date: 05/05/2023 CLINICAL DATA:  Provided history: Dental abscess. EXAM: CT MAXILLOFACIAL WITH CONTRAST TECHNIQUE: Multidetector CT imaging of the maxillofacial structures was performed with intravenous contrast. Multiplanar CT image reconstructions were also generated. RADIATION  DOSE REDUCTION: This exam was performed according to the departmental dose-optimization program which includes automated exposure control, adjustment of the mA and/or kV according to patient size and/or use of iterative reconstruction technique. CONTRAST:  75mL OMNIPAQUE IOHEXOL 350 MG/ML SOLN COMPARISON:  None. FINDINGS: Osseous: Very poor dentition with numerous carious and fractured teeth. Periapical lucency surrounding numerous teeth (some of which is expansile in appearance), compatible with periodontal disease and possible dental abscesses. Periapical lucency is most prominent surrounding the left maxillary lateral incisor, left maxillary canine, left maxillary first premolar, left maxillary first molar, left maxillary third molar, right maxillary first premolar, right maxillary second molar, right maxillary third molar, left mandibular medial incisor left mandibular second premolar, left mandibular first molar, left mandibular third molar, right mandibular medial incisor, right mandibular molars. Orbits: No orbital mass or acute orbital finding. Sinuses: Mild mucosal thickening within the inferior maxillary sinuses, bilaterally. Mild mucosal thickening within a right ethmoid air cell. Soft tissues: 1.4 x 1.2 cm ovoid low-attenuation subcutaneous lesion along the left aspect of the nose (series 3, image 53). Facial skin thickening and edema bilaterally (right greater than left). Limited intracranial: No evidence of an acute intracranial abnormality within the field of view. IMPRESSION: 1. Very poor dentition with numerous carious and fractured teeth. Periapical lucency surrounding numerous teeth (some which  is expansile appearance), compatible periodontal disease and possible dental abscesses, as described. 2. Facial skin thickening and edema bilaterally (right greater than left) suspicious for cellulitis. 3. 1.4 x 1.2 cm ovoid low-attenuation subcutaneous lesion along the left aspect of the nose. Although  nonspecific, this could reflect an abscess. Direct examination recommended. 4. Mild mucosal thickening within the inferior maxillary sinuses, bilaterally, potentially reflecting odontogenic sinusitis. 5. Mild right ethmoid sinus mucosal thickening. Electronically Signed   By: Jackey Loge D.O.   On: 05/05/2023 09:08   DG Chest Port 1 View  Result Date: 05/04/2023 CLINICAL DATA:  Chest pain EXAM: PORTABLE CHEST 1 VIEW COMPARISON:  None Available. FINDINGS: The heart size and mediastinal contours are within normal limits. Both lungs are clear. The visualized skeletal structures are unremarkable. IMPRESSION: No active disease. Electronically Signed   By: Larose Hires D.O.   On: 05/04/2023 18:42     Assessment & Plan:   Dental abscess - Ambulatory referral to Oral Maxillofacial Surgery   2. Fear of needles  - Ambulatory referral to Psychiatry   3. Acute pericarditis, unspecified type  - Ambulatory referral to Cardiology   Follow up:  Follow up in 3 months     Ivonne Andrew, NP 05/15/2023

## 2023-05-15 NOTE — Patient Instructions (Addendum)
1. Dental abscess  - Ambulatory referral to Oral Maxillofacial Surgery   2. Fear of needles  - Ambulatory referral to Psychiatry   3. Acute pericarditis, unspecified type  - Ambulatory referral to Cardiology   Follow up:  Follow up in 3 months

## 2023-05-15 NOTE — Assessment & Plan Note (Signed)
-   Ambulatory referral to Oral Maxillofacial Surgery   2. Fear of needles  - Ambulatory referral to Psychiatry   3. Acute pericarditis, unspecified type  - Ambulatory referral to Cardiology   Follow up:  Follow up in 3 months

## 2023-06-02 ENCOUNTER — Institutional Professional Consult (permissible substitution): Payer: Medicaid Other | Admitting: Clinical

## 2023-08-15 ENCOUNTER — Ambulatory Visit: Payer: Self-pay | Admitting: Nurse Practitioner

## 2023-10-20 ENCOUNTER — Emergency Department (HOSPITAL_BASED_OUTPATIENT_CLINIC_OR_DEPARTMENT_OTHER)
Admission: EM | Admit: 2023-10-20 | Discharge: 2023-10-20 | Discharge status: Against Medical Advice | Payer: Medicaid Other | Attending: Emergency Medicine | Admitting: Emergency Medicine

## 2023-10-20 ENCOUNTER — Other Ambulatory Visit: Payer: Self-pay

## 2023-10-20 ENCOUNTER — Ambulatory Visit (INDEPENDENT_AMBULATORY_CARE_PROVIDER_SITE_OTHER): Payer: Medicaid Other | Admitting: Nurse Practitioner

## 2023-10-20 ENCOUNTER — Encounter: Payer: Self-pay | Admitting: Nurse Practitioner

## 2023-10-20 VITALS — BP 123/72 | HR 95 | Temp 97.5°F | Wt 103.0 lb

## 2023-10-20 DIAGNOSIS — Z5321 Procedure and treatment not carried out due to patient leaving prior to being seen by health care provider: Secondary | ICD-10-CM | POA: Insufficient documentation

## 2023-10-20 DIAGNOSIS — K056 Periodontal disease, unspecified: Secondary | ICD-10-CM | POA: Diagnosis not present

## 2023-10-20 DIAGNOSIS — R6884 Jaw pain: Secondary | ICD-10-CM | POA: Insufficient documentation

## 2023-10-20 DIAGNOSIS — R110 Nausea: Secondary | ICD-10-CM | POA: Insufficient documentation

## 2023-10-20 MED ORDER — IBUPROFEN 600 MG PO TABS
600.0000 mg | ORAL_TABLET | Freq: Three times a day (TID) | ORAL | 0 refills | Status: AC | PRN
Start: 2023-10-20 — End: ?

## 2023-10-20 MED ORDER — ONDANSETRON HCL 4 MG PO TABS: 4.0000 mg | ORAL_TABLET | Freq: Three times a day (TID) | ORAL | 0 refills | Status: AC | PRN

## 2023-10-20 MED ORDER — AMOXICILLIN-POT CLAVULANATE 875-125 MG PO TABS
1.0000 | ORAL_TABLET | Freq: Two times a day (BID) | ORAL | 0 refills | Status: AC
Start: 2023-10-20 — End: 2023-11-03

## 2023-10-20 NOTE — Progress Notes (Signed)
 Acute Office Visit  Subjective:     Patient ID: Darius Kennedy, male    DOB: March 28, 1986, 38 y.o.   MRN: [id number]  Chief Complaint  Patient presents with   Emesis    Due to gums    HPI Mr Oyama  has a past medical history of ADHD (attention deficit hyperactivity disorder), Drug abuse (HCC), and Seasonal allergies.  Patient presents with complaints of worsening dental pain, also developed nausea yesterday .  He denies fever, chills, chest pain, shortness of breath.  They stated that the patient has seen the oral surgeon twice for tooth extraction but he walked out on them because it was 2 levels to get the procedure done.   Patient is accompanied by his parents with worried about him and the risk of not having his teeth extracted.  Patient was in a stable condition alert and oriented x 4.    Review of Systems  Constitutional:  Negative for appetite change, chills, fatigue and fever.  HENT:  Positive for dental problem. Negative for congestion, postnasal drip, rhinorrhea and sneezing.   Respiratory:  Negative for cough, shortness of breath and wheezing.   Cardiovascular:  Negative for chest pain, palpitations and leg swelling.  Gastrointestinal:  Negative for abdominal pain, constipation, nausea and vomiting.  Genitourinary:  Negative for difficulty urinating, dysuria, flank pain and frequency.  Musculoskeletal:  Negative for arthralgias, back pain, joint swelling and myalgias.  Skin:  Negative for color change, pallor, rash and wound.  Neurological:  Negative for dizziness, facial asymmetry, weakness, numbness and headaches.  Psychiatric/Behavioral:  Negative for behavioral problems, confusion, self-injury and suicidal ideas.         Objective:    BP 123/72   Pulse 95   Temp (!) 97.5 F (36.4 C)   Wt 103 lb (46.7 kg)   SpO2 97%   BMI 16.62 kg/m    Physical Exam Vitals reviewed.  Constitutional:      General: He is not in acute distress.    Appearance: He is not  ill-appearing, toxic-appearing or diaphoretic.  HENT:     Mouth/Throat:     Mouth: Mucous membranes are moist.     Dentition: Abnormal dentition. Dental tenderness and dental caries present.     Pharynx: Oropharynx is clear. No oropharyngeal exudate or posterior oropharyngeal erythema.     Comments: Poor dental hygiene Eyes:     General: No scleral icterus.       Right eye: No discharge.        Left eye: No discharge.     Extraocular Movements: Extraocular movements intact.     Conjunctiva/sclera: Conjunctivae normal.  Cardiovascular:     Rate and Rhythm: Normal rate and regular rhythm.     Pulses: Normal pulses.     Heart sounds: Normal heart sounds. No murmur heard. Pulmonary:     Effort: Pulmonary effort is normal. No respiratory distress.     Breath sounds: Normal breath sounds. No stridor. No wheezing, rhonchi or rales.  Chest:     Chest wall: No tenderness.  Abdominal:     General: There is no distension.     Palpations: Abdomen is soft.     Tenderness: There is no abdominal tenderness. There is no guarding.  Musculoskeletal:        General: No tenderness.     Cervical back: Normal range of motion and neck supple. No rigidity or tenderness.     Right lower leg: No edema.  Left lower leg: No edema.  Lymphadenopathy:     Cervical: No cervical adenopathy.  Skin:    General: Skin is warm and dry.     Capillary Refill: Capillary refill takes less than 2 seconds.  Neurological:     Mental Status: He is alert and oriented to person, place, and time.     Motor: No weakness.     Coordination: Coordination normal.     Gait: Gait normal.  Psychiatric:        Mood and Affect: Mood normal.        Behavior: Behavior normal.        Thought Content: Thought content normal.        Judgment: Judgment normal.     No results found for any visits on 10/20/23.      Assessment & Plan:   Problem List Items Addressed This Visit       Digestive   Periodontal disease   1.  Nausea (Primary)  - ondansetron  (ZOFRAN ) 4 MG tablet; Take 1 tablet (4 mg total) by mouth every 8 (eight) hours as needed for nausea or vomiting.  Dispense: 20 tablet; Refill: 0  2. Periodontal disease  - amoxicillin -clavulanate (AUGMENTIN ) 875-125 MG tablet; Take 1 tablet by mouth 2 (two) times daily for 14 days.  Dispense: 28 tablet; Refill: 0 - ibuprofen  (ADVIL ) 600 MG tablet; Take 1 tablet (600 mg total) by mouth every 8 (eight) hours as needed.  Dispense: 30 tablet; Refill: 0   Patient encouraged to take Augmentin  as ordered, take ibuprofen  as needed for pain take medication with food to help prevent GI side effects  He was encouraged to consider getting the dental procedure done as ordered, risk of having cardiac infection without getting the procedure discussed      Relevant Medications   amoxicillin -clavulanate (AUGMENTIN ) 875-125 MG tablet   ibuprofen  (ADVIL ) 600 MG tablet     Other   Nausea - Primary   1. Nausea (Primary)  - ondansetron  (ZOFRAN ) 4 MG tablet; Take 1 tablet (4 mg total) by mouth every 8 (eight) hours as needed for nausea or vomiting.  Dispense: 20 tablet; Refill: 0       Relevant Medications   ondansetron  (ZOFRAN ) 4 MG tablet    Meds ordered this encounter  Medications   amoxicillin -clavulanate (AUGMENTIN ) 875-125 MG tablet    Sig: Take 1 tablet by mouth 2 (two) times daily for 14 days.    Dispense:  28 tablet    Refill:  0   ibuprofen  (ADVIL ) 600 MG tablet    Sig: Take 1 tablet (600 mg total) by mouth every 8 (eight) hours as needed.    Dispense:  30 tablet    Refill:  0   ondansetron  (ZOFRAN ) 4 MG tablet    Sig: Take 1 tablet (4 mg total) by mouth every 8 (eight) hours as needed for nausea or vomiting.    Dispense:  20 tablet    Refill:  0    Return in about 6 months (around 04/18/2024) for CPE.  Linken Mcglothen R Jeraldean Wechter, FNP

## 2023-10-20 NOTE — Assessment & Plan Note (Signed)
 1. Nausea (Primary)  - ondansetron (ZOFRAN) 4 MG tablet; Take 1 tablet (4 mg total) by mouth every 8 (eight) hours as needed for nausea or vomiting.  Dispense: 20 tablet; Refill: 0

## 2023-10-20 NOTE — Patient Instructions (Signed)
 1. Nausea (Primary)  - ondansetron  (ZOFRAN ) 4 MG tablet; Take 1 tablet (4 mg total) by mouth every 8 (eight) hours as needed for nausea or vomiting.  Dispense: 20 tablet; Refill: 0  2. Periodontal disease  - amoxicillin -clavulanate (AUGMENTIN ) 875-125 MG tablet; Take 1 tablet by mouth 2 (two) times daily for 14 days.  Dispense: 28 tablet; Refill: 0 - ibuprofen  (ADVIL ) 600 MG tablet; Take 1 tablet (600 mg total) by mouth every 8 (eight) hours as needed.  Dispense: 30 tablet; Refill: 0    It is important that you exercise regularly at least 30 minutes 5 times a week as tolerated  Think about what you will eat, plan ahead. Choose  clean, green, fresh or frozen over canned, processed or packaged foods which are more sugary, salty and fatty. 70 to 75% of food eaten should be vegetables and fruit. Three meals at set times with snacks allowed between meals, but they must be fruit or vegetables. Aim to eat over a 12 hour period , example 7 am to 7 pm, and STOP after  your last meal of the day. Drink water,generally about 64 ounces per day, no other drink is as healthy. Fruit juice is best enjoyed in a healthy way, by EATING the fruit.  Thanks for choosing [facility] we consider it a privelige to serve you.

## 2023-10-20 NOTE — Assessment & Plan Note (Signed)
 1. Nausea (Primary)  - ondansetron  (ZOFRAN ) 4 MG tablet; Take 1 tablet (4 mg total) by mouth every 8 (eight) hours as needed for nausea or vomiting.  Dispense: 20 tablet; Refill: 0  2. Periodontal disease  - amoxicillin -clavulanate (AUGMENTIN ) 875-125 MG tablet; Take 1 tablet by mouth 2 (two) times daily for 14 days.  Dispense: 28 tablet; Refill: 0 - ibuprofen  (ADVIL ) 600 MG tablet; Take 1 tablet (600 mg total) by mouth every 8 (eight) hours as needed.  Dispense: 30 tablet; Refill: 0   Patient encouraged to take Augmentin  as ordered, take ibuprofen  as needed for pain take medication with food to help prevent GI side effects  He was encouraged to consider getting the dental procedure done as ordered, risk of having cardiac infection without getting the procedure discussed

## 2023-10-20 NOTE — ED Provider Triage Note (Addendum)
 Emergency Medicine Provider Triage Evaluation Note  BANDY HONAKER , a 38 y.o. male  was evaluated in triage.  Pt complains of left jaw pain.  Has multiple fractured and missing teeth.  No fever.  No difficulty breathing or difficulty swallowing.  Taking 10 to 12 tablets of Tylenol  a day does not know the strength..  Review of Systems  Positive: Dental pain Negative: Difficulty breathing difficulty swallowing, fever  Physical Exam  BP 129/69   Pulse (!) 110   Temp 98.1 F (36.7 C) (Oral)   Resp 18   Ht 5' 6 (1.676 m)   Wt 47.1 kg   SpO2 99%   BMI 16.76 kg/m  Gen:   Awake, no distress   Resp:  Normal effort  MSK:   Moves extremities without difficulty  Other:  Poor dentition throughout.  Multiple missing teeth.  Swelling to left lower jaw.  Floor mouth is soft.  Controlling secretions.  Medical Decision Making  Medically screening exam initiated at 6:54 AM.  Appropriate orders placed.  EUGEN JEANSONNE was informed that the remainder of the evaluation will be completed by another provider, this initial triage assessment does not replace that evaluation, and the importance of remaining in the ED until their evaluation is complete.  Discussed with patient blood work evaluation likely necessary.  He became upset because of a severe needle phobia and states he is not having any blood work and is going to leave. Some concern that he is taking too much acetaminophen  which can potentially cause liver damage.  Discussed blood work necessary to evaluate for this. Walked out of the ED.  Parents attempting to bring him back.   Carita Senior, MD 10/20/23 [id number]    Carita Senior, MD 10/20/23 [phone]

## 2023-10-20 NOTE — ED Triage Notes (Signed)
 Pt has been having left lower dental pain for 2 days. States it has gotten worse. Has been to dentist but it was too expensive.

## 2024-05-20 ENCOUNTER — Telehealth: Payer: Self-pay | Admitting: Nurse Practitioner

## 2024-05-20 ENCOUNTER — Other Ambulatory Visit: Payer: Self-pay

## 2024-05-20 ENCOUNTER — Ambulatory Visit (INDEPENDENT_AMBULATORY_CARE_PROVIDER_SITE_OTHER): Payer: Self-pay | Admitting: Nurse Practitioner

## 2024-05-20 ENCOUNTER — Encounter: Payer: Self-pay | Admitting: Nurse Practitioner

## 2024-05-20 VITALS — BP 121/98 | HR 97 | Wt 97.2 lb

## 2024-05-20 DIAGNOSIS — G8929 Other chronic pain: Secondary | ICD-10-CM

## 2024-05-20 DIAGNOSIS — K089 Disorder of teeth and supporting structures, unspecified: Secondary | ICD-10-CM | POA: Diagnosis not present

## 2024-05-20 MED ORDER — AMOXICILLIN 875 MG PO TABS: 875.0000 mg | ORAL_TABLET | Freq: Two times a day (BID) | ORAL | 0 refills | Status: DC

## 2024-05-20 MED ORDER — AMOXICILLIN 875 MG PO TABS
875.0000 mg | ORAL_TABLET | Freq: Two times a day (BID) | ORAL | 0 refills | Status: AC
Start: 2024-05-20 — End: 2024-05-30

## 2024-05-20 NOTE — Progress Notes (Signed)
 Subjective   Patient ID: Darius Kennedy, male    DOB: 1986-04-12, 38 y.o.   MRN: [id number]  Chief Complaint  Patient presents with   Dental Pain    Patient stated that he pulled his tooth out with his tongue. X 4days     Referring provider: Oley Darius RAMAN, NP  Darius Kennedy is a 38 y.o. male with Past Medical History: No date: ADHD (attention deficit hyperactivity disorder) No date: Drug abuse (HCC) No date: Seasonal allergies   Dental Pain     Patient presents today with dental pain.  He has been seen here for this in the past and has went to the emergency room for this.  We discussed that he does need to see an oral surgeon.  He states that he did see someone but they were not able to put him to sleep to have his teeth removed.  We will place a new referral for him today. Denies f/c/s, n/v/d, hemoptysis, PND, leg swelling Denies chest pain or edema     No Known Allergies   There is no immunization history on file for this patient.  Tobacco History: Social History   Tobacco Use  Smoking Status Every Day   Current packs/day: 1.00   Types: Cigarettes  Smokeless Tobacco Never   Ready to quit: Not Answered Counseling given: Yes   Outpatient Encounter Medications as of 05/20/2024  Medication Sig   amoxicillin  (AMOXIL ) 875 MG tablet Take 1 tablet (875 mg total) by mouth 2 (two) times daily for 10 days.   colchicine  0.6 MG tablet Take 1 tablet (0.6 mg total) by mouth daily. (Patient not taking: Reported on 10/20/2023)   ibuprofen  (ADVIL ) 600 MG tablet Take 1 tablet (600 mg total) by mouth every 8 (eight) hours as needed. (Patient not taking: Reported on 05/20/2024)   mupirocin  ointment (BACTROBAN ) 2 % Apply topically 2 (two) times daily. (Patient not taking: Reported on 05/20/2024)   ondansetron  (ZOFRAN ) 4 MG tablet Take 1 tablet (4 mg total) by mouth every 8 (eight) hours as needed for nausea or vomiting. (Patient not taking: Reported on 05/20/2024)   No  facility-administered encounter medications on file as of 05/20/2024.    Review of Systems  Review of Systems  Constitutional: Negative.   HENT: Negative.    Cardiovascular: Negative.   Gastrointestinal: Negative.   Allergic/Immunologic: Negative.   Neurological: Negative.   Psychiatric/Behavioral: Negative.       Objective:   BP (!) 121/98   Pulse 97   Wt 97 lb 3.2 oz (44.1 kg)   SpO2 100%   BMI 15.69 kg/m   Wt Readings from Last 5 Encounters:  05/20/24 97 lb 3.2 oz (44.1 kg)  10/20/23 103 lb (46.7 kg)  10/20/23 103 lb 13.4 oz (47.1 kg)  05/15/23 103 lb 12.8 oz (47.1 kg)  05/05/23 101 lb 4.8 oz (45.9 kg)     Physical Exam Vitals and nursing note reviewed.  Constitutional:      General: He is not in acute distress.    Appearance: He is well-developed.  Cardiovascular:     Rate and Rhythm: Normal rate and regular rhythm.  Pulmonary:     Effort: Pulmonary effort is normal.     Breath sounds: Normal breath sounds.  Skin:    General: Skin is warm and dry.  Neurological:     Mental Status: He is alert and oriented to person, place, and time.       Assessment & Plan:  Chronic dental pain -     Ambulatory referral to Oral Maxillofacial Surgery -     Amoxicillin ; Take 1 tablet (875 mg total) by mouth 2 (two) times daily for 10 days.  Dispense: 20 tablet; Refill: 0     Return if symptoms worsen or fail to improve.   Darius GORMAN Borer, NP 05/20/2024

## 2024-05-20 NOTE — Telephone Encounter (Signed)
 Copied from CRM #[id number]. Topic: Clinical - Prescription Issue >> May 20, 2024 12:13 PM Winona R wrote: Pt need this RX sent to another pharmacy as the current pharmacy does not have any power.  amoxicillin  (AMOXIL ) 875 MG tablet [[id number]] [address] [place], [place], [place] [address] [phone]

## 2024-05-20 NOTE — Telephone Encounter (Signed)
 Med was sent to another pharmacy per pt request. [facility]

## 2024-05-21 ENCOUNTER — Telehealth: Payer: Self-pay | Admitting: Nurse Practitioner

## 2024-05-21 NOTE — Telephone Encounter (Signed)
 Copied from CRM #[id number]. Topic: Referral - Status >> May 21, 2024 12:19 PM Zebedee SAUNDERS wrote: Reason for CRM: Receive call from CHRISTOPHER L. Harrison DDS PA Oral Surgery regarding referral # [id number] per [place] ph: [phone] pt's insurance is OON. Please redirect referral.

## 2024-07-30 ENCOUNTER — Encounter: Payer: Self-pay | Admitting: Internal Medicine

## 2024-07-30 ENCOUNTER — Ambulatory Visit: Attending: Internal Medicine | Admitting: Internal Medicine

## 2024-07-30 ENCOUNTER — Ambulatory Visit: Payer: Self-pay | Admitting: *Deleted

## 2024-07-30 VITALS — BP 123/81 | HR 108 | Temp 98.1°F | Ht 66.0 in | Wt 100.0 lb

## 2024-07-30 DIAGNOSIS — G8929 Other chronic pain: Secondary | ICD-10-CM

## 2024-07-30 DIAGNOSIS — K089 Disorder of teeth and supporting structures, unspecified: Secondary | ICD-10-CM

## 2024-07-30 DIAGNOSIS — K056 Periodontal disease, unspecified: Secondary | ICD-10-CM

## 2024-07-30 DIAGNOSIS — K029 Dental caries, unspecified: Secondary | ICD-10-CM | POA: Diagnosis not present

## 2024-07-30 NOTE — Telephone Encounter (Signed)
 FYI Only or Action Required?: FYI only for provider.  Patient was last seen in primary care on 05/20/2024 by Oley Bascom RAMAN, NP.  Called Nurse Triage reporting Dental Pain.  Symptoms began several months ago.  Interventions attempted: OTC medications: Tylenol .  Symptoms are: gradually worsening.  Triage Disposition: See HCP Within 4 Hours (Or PCP Triage)  Patient/caregiver understands and will follow disposition?: yes   Reason for Disposition  [1] SEVERE pain (e.g., excruciating, unable to eat, unable to do any normal activities) AND [2] not improved 2 hours after pain medicine  Answer Assessment - Initial Assessment Questions 1. LOCATION: Which tooth is hurting?  (e.g., right-side/left-side, upper/lower, front/back)     All teeth- need to be extracted 2. ONSET: When did the toothache start?  (e.g., hours, days)      Chronic- patient has been to the oral surgeon and he has not proceeded- patient has walked out 3 times 3. SEVERITY: How bad is the toothache?  (Scale 1-10; mild, moderate or severe)     severe 4. SWELLING: Is there any visible swelling of your face?     unsure 5. OTHER SYMPTOMS: Do you have any other symptoms? (e.g., fever)     unsure  Protocols used: Toothache-A-AH Copied from CRM #[id number]. Topic: Clinical - Red Word Triage >> Jul 30, 2024  8:05 AM Ivette P wrote: Red Word that prompted transfer to Nurse Triage: teeth infected, pt is in really bad pain.

## 2024-07-30 NOTE — Patient Instructions (Signed)
  VISIT SUMMARY: Today, you came in with concerns about dental issues and back pain. We discussed the condition of your teeth and the recurring infections you have been experiencing.  YOUR PLAN: -SEVERE DENTAL CARIES WITH MULTIPLE BROKEN AND INFECTED TEETH: You have severe tooth decay and multiple broken and infected teeth. This condition has led to chronic infections and pus formation. To address the infection, I have prescribed liquid amoxicillin . You should reschedule with the  oral surgeon to schedule a full dental extraction, which is necessary to resolve these issues.  INSTRUCTIONS: Please take the liquid amoxicillin  as prescribed to manage the infection. We will follow up with the oral surgeon to schedule your dental extraction. If you experience any worsening symptoms or have any concerns, please contact our office.                      Contains text generated by Abridge.                                 Contains text generated by Abridge.

## 2024-07-30 NOTE — Progress Notes (Signed)
 Patient ID: Darius Kennedy, male    DOB: [birth year]  MRN: [id number]  CC: Dental Pain (Tooth pain X4 days )   Subjective: Darius Kennedy is a 38 y.o. male who presents for UC visit. Mom is with him His concerns today include:   Discussed the use of AI scribe software for clinical note transcription with the patient, who gave verbal consent to proceed.  History of Present Illness Darius Kennedy is a 38 year old male who presents with dental issues and back pain. He is accompanied by his mother. Mother provides most of the history.  He reports broken teeth in the lower jaw, with some teeth broken off in the gum. Pain in the lower jaw is present, and there is a history of pus pockets around the teeth. All thirty-two teeth are reportedly in poor condition. He has previously walked out on three oral surgeons due to nervousness and has experienced recurrent infections despite antibiotic treatment, with symptoms returning every three to four months.  Swelling of the gums extends to the back of the mouth, and he describes a 'little piece of meat' on the side of the swelling. He has been prescribed antibiotics in the past and is not allergic to any antibiotics. His mother requests that he be given liquid antibiotics as he has difficulty swallowing large pills.      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 (Patient not taking: Reported on 07/30/2024) 90 tablet 0   ibuprofen  (ADVIL ) 600 MG tablet Take 1 tablet (600 mg total) by mouth every  8 (eight) hours as needed. (Patient not taking: Reported on 07/30/2024) 30 tablet 0   mupirocin  ointment (BACTROBAN ) 2 % Apply topically 2 (two) times daily. (Patient not taking: Reported on 07/30/2024) 22 g 0   ondansetron  (ZOFRAN ) 4 MG tablet Take 1 tablet (4 mg total) by mouth every 8 (eight) hours as needed for nausea or vomiting. (Patient not taking: Reported on 07/30/2024) 20 tablet 0   No current facility-administered medications on file prior to visit.    No Known Allergies  Social History   Socioeconomic History   Marital status: Single    Spouse name: Not on file   Number of children: Not on file   Years of education: Not on file   Highest education level: Not on file  Occupational History   Not on file  Tobacco Use   Smoking status: Every Day    Current packs/day: 1.00    Types: Cigarettes   Smokeless tobacco: Never  Substance and Sexual Activity   Alcohol use: No   Drug use: Yes    Frequency: 7.0 times per week    Types: Marijuana    Comment: crystal meth   Sexual activity: Not on file  Other Topics Concern   Not on file  Social History Narrative   Not on file   Social Drivers of Health   Financial Resource Strain: Low Risk  (07/30/2024)   Overall Financial Resource Strain (CARDIA)    Difficulty  of Paying Living Expenses: Not very hard  Food Insecurity: No Food Insecurity (07/30/2024)   Hunger Vital Sign    Worried About Running Out of Food in the Last Year: Never true    Ran Out of Food in the Last Year: Never true  Transportation Needs: No Transportation Needs (07/30/2024)   PRAPARE - Administrator, Civil Service (Medical): No    Lack of Transportation (Non-Medical): No  Physical Activity: Inactive (07/30/2024)   Exercise Vital Sign    Days of Exercise per Week: 0 days    Minutes of Exercise per Session: 0 min  Stress: No Stress Concern Present (07/30/2024)   [employer] of Occupational Health - Occupational Stress Questionnaire     Feeling of Stress: Not at all  Social Connections: Not on file  Intimate Partner Violence: Not At Risk (07/30/2024)   Humiliation, Afraid, Rape, and Kick questionnaire    Fear of Current or Ex-Partner: No    Emotionally Abused: No    Physically Abused: No    Sexually Abused: No    Family History  Problem Relation Age of Onset   Diabetes Mother    Hyperlipidemia Mother    Hypertension Mother    Heart attack Mother    Diabetes Father    Hypertension Brother    Heart attack Maternal Uncle 4   Schizophrenia Paternal Uncle        paranoid   Bipolar disorder Maternal Grandmother    Heart attack Maternal Grandmother    Lung cancer Paternal Grandfather        small cell   Heart attack Paternal Grandfather        resulted in death   Sudden death Neg Hx     Past Surgical History:  Procedure Laterality Date   benign mass  1991   18 mo- removed from flank   TYMPANOSTOMY TUBE PLACEMENT Bilateral     ROS: Review of Systems Negative except as stated above  PHYSICAL EXAM: BP 123/81 (BP Location: Left Arm, Patient Position: Sitting, Cuff Size: Normal)   Pulse (!) 108   Temp 98.1 F (36.7 C) (Oral)   Ht 5' 6 (1.676 m)   Wt 100 lb (45.4 kg)   SpO2 96%   BMI 16.14 kg/m   Physical Exam   General appearance -age Caucasian male in NAD Mental status -patient is alert and oriented.  Poor historian. Mouth -patient has significant gum disease with inflammation of his gums.  All of his teeth are rotted and broken off in the gum except for one of his canine upper jaw.     Latest Ref Rng & Units 05/05/2023    3:23 AM 05/04/2023    6:43 PM  CMP  Glucose 70 - 99 mg/dL 860  831   BUN 6 - 20 mg/dL 12  8   Creatinine 9.38 - 1.24 mg/dL 9.05  9.13   Sodium 864 - 145 mmol/L 135  137   Potassium 3.5 - 5.1 mmol/L 4.2  3.8   Chloride 98 - 111 mmol/L 98  100   CO2 22 - 32 mmol/L 22  19   Calcium  8.9 - 10.3 mg/dL 9.8  89.3   Total Protein 6.5 - 8.1 g/dL 7.1  8.6   Total Bilirubin 0.3 -  1.2 mg/dL 0.7  0.8   Alkaline Phos 38 - 126 U/L 106  127   AST 15 - 41 U/L 26  27   ALT 0 - 44 U/L 44  54  Lipid Panel     Component Value Date/Time   CHOL 138 05/04/2023 1843   TRIG 170 (H) 05/04/2023 1843   HDL 39 (L) 05/04/2023 1843   CHOLHDL 3.5 05/04/2023 1843   VLDL 34 05/04/2023 1843   LDLCALC 65 05/04/2023 1843    CBC    Component Value Date/Time   WBC 18.1 (H) 05/05/2023 0323   RBC 5.12 05/05/2023 0323   HGB 14.9 05/05/2023 0323   HCT 43.3 05/05/2023 0323   PLT 346 05/05/2023 0323   MCV 84.6 05/05/2023 0323   MCH 29.1 05/05/2023 0323   MCHC 34.4 05/05/2023 0323   RDW 13.5 05/05/2023 0323   LYMPHSABS 2.5 05/04/2023 1843   MONOABS 2.0 (H) 05/04/2023 1843   EOSABS 0.0 05/04/2023 1843   BASOSABS 0.1 05/04/2023 1843    ASSESSMENT AND PLAN: 1. Periodontal disease (Primary) Encourage patient to allow the oral surgeon to do what needs to be done.  He needs to have all of his teeth removed.  His PCP most likely will need to prescribe something like Xanax for him to take prior to going to the appointment with the oral surgeon. - amoxicillin  (AMOXIL ) 250 MG/5ML suspension; Take 10 mLs (500 mg total) by mouth 3 (three) times daily.  Dispense: 210 mL; Refill: 0  2. Dental cavities 3. Chronic dental pain See #1 above   Patient was given the opportunity to ask questions.  Patient verbalized understanding of the plan and was able to repeat key elements of the plan.   This documentation was completed using Paediatric nurse.  Any transcriptional errors are unintentional.  No orders of the defined types were placed in this encounter.    Requested Prescriptions   Signed Prescriptions Disp Refills   amoxicillin  (AMOXIL ) 250 MG/5ML suspension 210 mL 0    Sig: Take 10 mLs (500 mg total) by mouth 3 (three) times daily.    Return if symptoms worsen or fail to improve.  Barnie Louder, MD, FACP
